# Patient Record
Sex: Male | Born: 1935 | Race: White | Hispanic: No | Smoking: Never smoker
Health system: Southern US, Community
[De-identification: ages and names within clinical notes are randomized; demographics above are authoritative.]

## PROBLEM LIST (undated history)

## (undated) DIAGNOSIS — I4891 Unspecified atrial fibrillation: Secondary | ICD-10-CM

---

## 2013-08-22 ENCOUNTER — Encounter (HOSPITAL_COMMUNITY): Payer: Self-pay | Admitting: Emergency Medicine

## 2013-08-22 ENCOUNTER — Emergency Department (HOSPITAL_COMMUNITY): Payer: Medicare PPO

## 2013-08-22 ENCOUNTER — Inpatient Hospital Stay (HOSPITAL_COMMUNITY)
Admission: EM | Admit: 2013-08-22 | Discharge: 2013-08-31 | DRG: 287 | Disposition: A | Discharge status: Home / Self Care | Payer: Medicare PPO | Attending: Internal Medicine | Admitting: Internal Medicine

## 2013-08-22 DIAGNOSIS — I2510 Atherosclerotic heart disease of native coronary artery without angina pectoris: Secondary | ICD-10-CM | POA: Diagnosis present

## 2013-08-22 DIAGNOSIS — M545 Low back pain, unspecified: Secondary | ICD-10-CM

## 2013-08-22 DIAGNOSIS — I25810 Atherosclerosis of coronary artery bypass graft(s) without angina pectoris: Secondary | ICD-10-CM

## 2013-08-22 DIAGNOSIS — R0989 Other specified symptoms and signs involving the circulatory and respiratory systems: Secondary | ICD-10-CM | POA: Diagnosis present

## 2013-08-22 DIAGNOSIS — R0609 Other forms of dyspnea: Secondary | ICD-10-CM | POA: Diagnosis present

## 2013-08-22 DIAGNOSIS — I10 Essential (primary) hypertension: Secondary | ICD-10-CM

## 2013-08-22 DIAGNOSIS — I4891 Unspecified atrial fibrillation: Principal | ICD-10-CM | POA: Diagnosis present

## 2013-08-22 DIAGNOSIS — E7800 Pure hypercholesterolemia, unspecified: Secondary | ICD-10-CM | POA: Diagnosis present

## 2013-08-22 DIAGNOSIS — M62838 Other muscle spasm: Secondary | ICD-10-CM | POA: Diagnosis present

## 2013-08-22 DIAGNOSIS — R55 Syncope and collapse: Secondary | ICD-10-CM | POA: Diagnosis present

## 2013-08-22 DIAGNOSIS — E876 Hypokalemia: Secondary | ICD-10-CM | POA: Diagnosis present

## 2013-08-22 HISTORY — DX: Unspecified atrial fibrillation: I48.91

## 2013-08-22 LAB — CBC WITH DIFFERENTIAL/PLATELET
BASOS PCT: 0 % (ref 0–1)
Basophils Absolute: 0 10*3/uL (ref 0.0–0.1)
Eosinophils Absolute: 0 10*3/uL (ref 0.0–0.7)
Eosinophils Relative: 0 % (ref 0–5)
HCT: 44.5 % (ref 39.0–52.0)
Hemoglobin: 15.3 g/dL (ref 13.0–17.0)
Lymphocytes Relative: 22 % (ref 12–46)
Lymphs Abs: 1.3 10*3/uL (ref 0.7–4.0)
MCH: 31.9 pg (ref 26.0–34.0)
MCHC: 34.4 g/dL (ref 30.0–36.0)
MCV: 92.7 fL (ref 78.0–100.0)
Monocytes Absolute: 0.8 10*3/uL (ref 0.1–1.0)
Monocytes Relative: 14 % — ABNORMAL HIGH (ref 3–12)
NEUTROS PCT: 64 % (ref 43–77)
Neutro Abs: 3.8 10*3/uL (ref 1.7–7.7)
PLATELETS: 213 10*3/uL (ref 150–400)
RBC: 4.8 MIL/uL (ref 4.22–5.81)
RDW: 14.4 % (ref 11.5–15.5)
WBC: 6 10*3/uL (ref 4.0–10.5)

## 2013-08-22 LAB — BASIC METABOLIC PANEL
BUN: 9 mg/dL (ref 6–23)
CALCIUM: 8.7 mg/dL (ref 8.4–10.5)
CHLORIDE: 96 meq/L (ref 96–112)
CO2: 28 mEq/L (ref 19–32)
Creatinine, Ser: 0.51 mg/dL (ref 0.50–1.35)
GFR calc Af Amer: >90 mL/min (ref >90)
GFR calc non Af Amer: >90 mL/min (ref >90)
Glucose, Bld: 102 mg/dL — ABNORMAL HIGH (ref 70–99)
POTASSIUM: 4.1 meq/L (ref 3.7–5.3)
SODIUM: 136 meq/L — AB (ref 137–147)

## 2013-08-22 LAB — I-STAT TROPONIN, ED: Troponin i, poc: 0 ng/mL (ref 0.00–0.08)

## 2013-08-22 MED ORDER — DILTIAZEM HCL 100 MG IV SOLR
5.0000 mg/h | INTRAVENOUS | Status: DC
  Administered 2013-08-22: 5 mg/h via INTRAVENOUS

## 2013-08-22 MED ORDER — DILTIAZEM HCL 25 MG/5ML IV SOLN: 10.0000 mg | Freq: Once | INTRAVENOUS | Status: DC

## 2013-08-22 MED ORDER — SODIUM CHLORIDE 0.9 % IV BOLUS (SEPSIS)
500.0000 mL | Freq: Once | INTRAVENOUS | Status: AC
  Administered 2013-08-22: 500 mL via INTRAVENOUS

## 2013-08-22 MED ORDER — DILTIAZEM LOAD VIA INFUSION
10.0000 mg | Freq: Once | INTRAVENOUS | Status: AC
Start: 2013-08-22 — End: 2013-08-22
  Administered 2013-08-22: 10 mg via INTRAVENOUS
  Filled 2013-08-22: qty 10

## 2013-08-22 MED ORDER — MORPHINE SULFATE 4 MG/ML IJ SOLN
4.0000 mg | Freq: Once | INTRAMUSCULAR | Status: AC
  Administered 2013-08-22: 4 mg via INTRAVENOUS
  Filled 2013-08-22: qty 1

## 2013-08-22 NOTE — ED Notes (Signed)
Patient is alert and oriented x3.  He is complaining of back pain that started this morning and progressively got  Worse after lunch.  He states that he has intermittently had this problem before.  Currently he rates his pain 8 to 10.

## 2013-08-22 NOTE — ED Notes (Addendum)
Pt BIB EMS, reports that pt has been "passing out and falling for the past few weeks" but has not passed out or fallen today. Pt states he is having increased lower back pain since awakening at 0700, shooting into his R leg and R leg feels weaker than his L. Pt drove 2 hours from [place] to [place] recently, also states that he is scheduled to see a cardiologist for follow-up of symptoms. Pt a&o x4, skin warm and dry, ambulatory to triage.

## 2013-08-22 NOTE — ED Provider Notes (Signed)
TIME SEEN: 10:30 PM   CHIEF COMPLAINT: Lower back pain  HPI: Patient is a 80 y.o. M with history of atrial fibrillation no longer on anticoagulation who presents the emergency department with complaints of lower back pain that started yesterday. He describes it as a tightening feeling is worse with walking. He denies to me that he has ever had back pain before. He denies any new numbness, weakness, bowel or bladder incontinence, fever. No history of injury. No history of increased activity.  During my evaluation when vitals were being taken, patient was found to be in atrial fibrillation with RVR.  He denies any chest pain or shortness of breath. He reports he has had multiple syncopal events in the past but none today. He states his last episode was last Saturday over a week ago. He states that his episodes will occur during activity and sometimes at rest. He feels that afterwards he has his "muscles lock up". He is scheduled to followup with his cardiologist as an outpatient for this. No palpitations. He is not on anticoagulation for his atrial fibrillation. He states he does not take any medications to control his heart rate.  This patient's primary care physician and cardiologist are in [place]. He is here visiting his daughter who is in a skilled nursing facility.  ROS: See HPI Constitutional: no fever  Eyes: no drainage  ENT: no runny nose   Cardiovascular:  no chest pain  Resp: no SOB  GI: no vomiting GU: no dysuria Integumentary: no rash  Allergy: no hives  Musculoskeletal: no leg swelling  Neurological: no slurred speech ROS otherwise negative  PAST MEDICAL HISTORY/PAST SURGICAL HISTORY:  History reviewed. No pertinent past medical history.  MEDICATIONS:  Prior to Admission medications   Medication Sig Start Date End Date Taking? Authorizing Provider  COD LIVER OIL PO Take by mouth.   Yes Historical Provider, MD  Glucosamine-Chondroit-MSM-C-Mn (FLEXI JOINT PO) Take  1 tablet by mouth daily.   Yes Historical Provider, MD  Multiple Vitamin (MULTIVITAMIN WITH MINERALS) TABS tablet Take 1 tablet by mouth daily.   Yes Historical Provider, MD  Saw Palmetto, Serenoa repens, (SAW PALMETTO PO) Take 1 tablet by mouth daily.   Yes Historical Provider, MD  VITAMIN E PO Take 1 tablet by mouth daily.   Yes Historical Provider, MD    ALLERGIES:  No Known Allergies  SOCIAL HISTORY:  History  Substance Use Topics  . Smoking status: Never Smoker   . Smokeless tobacco: Not on file  . Alcohol Use: Yes    FAMILY HISTORY: No family history on file.  EXAM: BP 128/109  Pulse 163  Temp(Src) 98.3 F (36.8 C) (Oral)  Resp 23  SpO2 98% CONSTITUTIONAL: Alert and oriented and responds appropriately to questions. Well-appearing; well-nourished HEAD: Normocephalic EYES: Conjunctivae clear, PERRL ENT: normal nose; no rhinorrhea; moist mucous membranes; pharynx without lesions noted; poor dentition NECK: Supple, no meningismus, no LAD  CARD: Irregularly irregular; S1 and S2 appreciated; no murmurs, no clicks, no rubs, no gallops RESP: Normal chest excursion without splinting or tachypnea; breath sounds clear and equal bilaterally; no wheezes, no rhonchi, no rales,  ABD/GI: Normal bowel sounds; non-distended; soft, non-tender, no rebound, no guarding BACK:  Diffusely tender to palpation across the lower lumbar region with no midline spinal tenderness or step-off or deformity, The back appears normal; there is no CVA tenderness; patient does have some muscle spasming and tightening of the paraspinal muscles of the lumbar spine EXT: Normal ROM in all  joints; non-tender to palpation; no edema; normal capillary refill; no cyanosis    SKIN: Normal color for age and race; warm NEURO: Moves all extremities equally; sensation to light touch intact diffusely, negative straight leg raise, 2+ deep tendon reflexes in bilateral upper and lower extremities, cranial nerves II through XII  intact PSYCH: The patient's mood and manner are appropriate. Grooming and personal hygiene are appropriate.  MEDICAL DECISION MAKING: Patient here with back pain and is likely related to muscle spasm. He is neurologically intact on exam and denies any red flag symptoms. We'll obtain x-ray of his lumbar spine given his age and give pain medication. Patient was also started in A. fib with RVR. He states he has had syncopal events in the past but no chest pain or shortness of breath today. No syncopal event today. We'll obtain basic labs, troponin, give IV fluids, diltiazem. Patient will likely need admission for syncopal workup and control of his heart rate.  ED PROGRESS: Patient's labs are unremarkable. Troponin negative.  UA pending.  Lumbar x-ray shows degenerative changes but no acute fracture. Official radiology read pending. Discussed with Dr. Julian Reil  hospitalist for admission to step down.     EKG Interpretation  Date/Time:  Tuesday Aug 22 2013 22:22:45 EDT Ventricular Rate:  157 PR Interval:  162 QRS Duration: 89 QT Interval:  299 QTC Calculation: 483 R Axis:   2 Text Interpretation:  Atrial fibrillation with RVR  Low voltage, extremity and precordial leads Anteroseptal infarct, old Minimal ST depression, inferior leads Borderline T abnormalities, diffuse leads Baseline wander in lead(s) V5 No old tracing to compare Confirmed by Auryn Paige,  DO, Gideon Burstein [id number]) on 08/22/2013 11:10:31 PM        Layla Maw Ashaki Frosch, DO 08/23/13 0006

## 2013-08-22 NOTE — ED Notes (Addendum)
Has had back pain that comes and goes. Hx syncopal episodes September 2014 and last Saturday. Says after fall in September 2014 his right leg is weaker than left. Reports "my muscles get so tense and sore as I pass out. I can't move them and I just hit the floor. I think these are minor strokes or an arthritis." Denies dizziness or light headedness. Denies loss of vision. Denies surgical hx or any precipitating factors. Does not take any medications besides vitamins at this time. Does report possible afib hx. Hooked up to EKG. MD Ward in to see patient at this time.

## 2013-08-23 ENCOUNTER — Encounter (HOSPITAL_COMMUNITY): Payer: Self-pay | Admitting: Internal Medicine

## 2013-08-23 DIAGNOSIS — M545 Low back pain, unspecified: Secondary | ICD-10-CM

## 2013-08-23 DIAGNOSIS — I059 Rheumatic mitral valve disease, unspecified: Secondary | ICD-10-CM

## 2013-08-23 DIAGNOSIS — I4891 Unspecified atrial fibrillation: Principal | ICD-10-CM

## 2013-08-23 DIAGNOSIS — R55 Syncope and collapse: Secondary | ICD-10-CM | POA: Diagnosis present

## 2013-08-23 LAB — URINALYSIS, ROUTINE W REFLEX MICROSCOPIC
Glucose, UA: NEGATIVE mg/dL
HGB URINE DIPSTICK: NEGATIVE
Ketones, ur: 15 mg/dL — AB
NITRITE: NEGATIVE
Protein, ur: 30 mg/dL — AB
SPECIFIC GRAVITY, URINE: 1.025 (ref 1.005–1.030)
Urobilinogen, UA: 4 mg/dL — ABNORMAL HIGH (ref 0.0–1.0)
pH: 7 (ref 5.0–8.0)

## 2013-08-23 LAB — URINE MICROSCOPIC-ADD ON

## 2013-08-23 LAB — MRSA PCR SCREENING: MRSA by PCR: NEGATIVE

## 2013-08-23 MED ORDER — CYCLOBENZAPRINE HCL 10 MG PO TABS
10.0000 mg | ORAL_TABLET | Freq: Three times a day (TID) | ORAL | Status: DC | PRN
  Administered 2013-08-23 – 2013-08-27 (×4): 10 mg via ORAL
  Filled 2013-08-23 (×4): qty 1

## 2013-08-23 MED ORDER — HEPARIN SODIUM (PORCINE) 5000 UNIT/ML IJ SOLN
5000.0000 [IU] | Freq: Three times a day (TID) | INTRAMUSCULAR | Status: DC
  Administered 2013-08-23 – 2013-08-26 (×10): 5000 [IU] via SUBCUTANEOUS
  Filled 2013-08-23 (×13): qty 1

## 2013-08-23 MED ORDER — SODIUM CHLORIDE 0.9 % IV SOLN
INTRAVENOUS | Status: DC
  Administered 2013-08-23 (×3): via INTRAVENOUS

## 2013-08-23 MED ORDER — DILTIAZEM HCL 30 MG PO TABS
30.0000 mg | ORAL_TABLET | Freq: Three times a day (TID) | ORAL | Status: DC
  Administered 2013-08-23 (×3): 30 mg via ORAL
  Filled 2013-08-23 (×7): qty 1

## 2013-08-23 MED ORDER — TRAMADOL HCL 50 MG PO TABS
50.0000 mg | ORAL_TABLET | Freq: Four times a day (QID) | ORAL | Status: DC | PRN
  Administered 2013-08-23 – 2013-08-25 (×4): 50 mg via ORAL
  Filled 2013-08-23 (×4): qty 1

## 2013-08-23 MED ORDER — DIAZEPAM 5 MG PO TABS
5.0000 mg | ORAL_TABLET | Freq: Four times a day (QID) | ORAL | Status: DC | PRN
  Administered 2013-08-23 (×2): 5 mg via ORAL
  Filled 2013-08-23 (×2): qty 1

## 2013-08-23 MED ORDER — ACETAMINOPHEN 325 MG PO TABS
650.0000 mg | ORAL_TABLET | Freq: Four times a day (QID) | ORAL | Status: DC | PRN
  Administered 2013-08-23 – 2013-08-24 (×2): 650 mg via ORAL
  Filled 2013-08-23 (×2): qty 2

## 2013-08-23 NOTE — Progress Notes (Signed)
CARE MANAGEMENT NOTE 08/23/2013  Patient:  Vincent Benjamin, Vincent Benjamin   Account Number:  [id number]  Date Initiated:  08/23/2013  Documentation initiated by:  DAVIS,RHONDA  Subjective/Objective Assessment:   atrial fib with rvr requiring iv Cardizem drip for control.     Action/Plan:   lives alone does have a family support system.   Anticipated DC Date:  08/26/2013   Anticipated DC Plan:  HOME/SELF CARE  In-house referral  NA      DC Planning Services  NA      [facility] Choice  NA   Choice offered to / List presented to:  NA   DME arranged  NA      DME agency  NA     HH arranged  NA      HH agency  NA   Status of service:  In process, will continue to follow Medicare Important Message given?  NA - LOS <3 / Initial given by admissions (If response is "NO", the following Medicare IM given date fields will be blank) Date Medicare IM given:   Date Additional Medicare IM given:    Discharge Disposition:    Per UR Regulation:  Reviewed for med. necessity/level of care/duration of stay  If discussed at Long Length of Stay Meetings, dates discussed:    Comments:  [id number]/Rhonda Stark Jock, BSN, [place] [phone] Chart Reviewed for discharge and hospital needs. Discharge needs at time of review: None present will follow for needs. Review of patient progress due on [id number].

## 2013-08-23 NOTE — Progress Notes (Signed)
Patient seen and evaluated earlier this am by my associate.  Will transition patient to oral cardizem and continue to monitor closely in step down unit.  Reassess next am.  Penny Pia

## 2013-08-23 NOTE — H&P (Addendum)
[facility] History and Physical  Vincent CraverRobert Stefanski ZOX:[id number] DOB: [birth year] DOA: 08/22/2013  Referring physician: EDP PCP: No primary provider on file.   Chief Complaint: Back pain   HPI: Vincent Benjamin is a 80 y.o. male from [place], in town visiting daughter who is in a SNF.  He presents to the ED with c/o back pain and muscle spasms.  Work up in the ED has revealed the back pain to most likely be musculoskeletal; however, during his work up his heart rate was noted to be in the 160s and he was found to be in A.Fib with RVR.  He states that he is asymptomatic from this, no CP no SOB, does occasionally have "passing-out" episodes which he describes as his "muscles lock up" and he is "unable to move due to the spasm", does not loose consciousness during these however he states.  He has a history of A.Fib, follows with a cardiologist in [place] but does not take any rate control meds and declines blood thinners (sounds more like a patient refusal than a medical reason not to use them).  In ED, rate controlled with cardizem gtt.  Review of Systems: Systems reviewed.  As above, otherwise negative  Past Medical History  Diagnosis Date  . Atrial fibrillation with RVR    History reviewed. No pertinent past surgical history. Social History:  reports that he has never smoked. He does not have any smokeless tobacco history on file. He reports that he drinks alcohol. His drug history is not on file.  No Known Allergies  No family history on file.   Prior to Admission medications   Medication Sig Start Date End Date Taking? Authorizing Provider  COD LIVER OIL PO Take by mouth.   Yes Historical Provider, MD  Glucosamine-Chondroit-MSM-C-Mn (FLEXI JOINT PO) Take 1 tablet by mouth daily.   Yes Historical Provider, MD  Multiple Vitamin (MULTIVITAMIN WITH MINERALS) TABS tablet Take 1 tablet by mouth daily.   Yes Historical Provider, MD  Saw Palmetto, Serenoa repens, (SAW PALMETTO PO) Take  1 tablet by mouth daily.   Yes Historical Provider, MD  VITAMIN E PO Take 1 tablet by mouth daily.   Yes Historical Provider, MD   Physical Exam: Filed Vitals:   08/23/13 0010  BP: 104/85  Pulse:   Temp:   Resp: 24    BP 104/85  Pulse 163  Temp(Src) 98.3 F (36.8 C) (Oral)  Resp 24  SpO2 98%  General Appearance:    Alert, oriented, no distress, appears stated age  Head:    Normocephalic, atraumatic  Eyes:    PERRL, EOMI, sclera non-icteric        Nose:   Nares without drainage or epistaxis. Mucosa, turbinates normal  Throat:   Moist mucous membranes. Oropharynx without erythema or exudate.  Neck:   Supple. No carotid bruits.  No thyromegaly.  No lymphadenopathy.   Back:     No CVA tenderness, no spinal tenderness  Lungs:     Clear to auscultation bilaterally, without wheezes, rhonchi or rales  Chest wall:    No tenderness to palpitation  Heart:    Irr, Irr, tachycardic  Abdomen:     Soft, non-tender, nondistended, normal bowel sounds, no organomegaly  Genitalia:    deferred  Rectal:    deferred  Extremities:   No clubbing, cyanosis or edema.  Pulses:   2+ and symmetric all extremities  Skin:   Skin color, texture, turgor normal, no rashes or lesions  Lymph nodes:  Cervical, supraclavicular, and axillary nodes normal  Neurologic:   CNII-XII intact. Normal strength, sensation and reflexes      throughout    Labs on Admission:  Basic Metabolic Panel:  Recent Labs Lab 08/22/13 2234  NA 136*  K 4.1  CL 96  CO2 28  GLUCOSE 102*  BUN 9  CREATININE 0.51  CALCIUM 8.7   Liver Function Tests: No results found for this basename: AST, ALT, ALKPHOS, BILITOT, PROT, ALBUMIN,  in the last 168 hours No results found for this basename: LIPASE, AMYLASE,  in the last 168 hours No results found for this basename: AMMONIA,  in the last 168 hours CBC:  Recent Labs Lab 08/22/13 2234  WBC 6.0  NEUTROABS 3.8  HGB 15.3  HCT 44.5  MCV 92.7  PLT 213   Cardiac Enzymes: No  results found for this basename: CKTOTAL, CKMB, CKMBINDEX, TROPONINI,  in the last 168 hours  BNP (last 3 results) No results found for this basename: PROBNP,  in the last 8760 hours CBG: No results found for this basename: GLUCAP,  in the last 168 hours  Radiological Exams on Admission: Dg Lumbar Spine Complete  08/23/2013   CLINICAL DATA:  Severe lower back pain, radiating down both legs.  EXAM: LUMBAR SPINE - COMPLETE 4+ VIEW  COMPARISON:  None.  FINDINGS: There is no evidence of fracture or subluxation. Vertebral bodies demonstrate normal height and alignment. Mild multilevel disc space narrowing is noted, with scattered anterior disc osteophyte complexes. There is mild right convex thoracolumbar scoliosis.  The visualized bowel gas pattern is unremarkable in appearance; air and stool are noted within the colon. The sacroiliac joints are within normal limits. Diffuse vascular calcifications are seen.  IMPRESSION: 1. No evidence of fracture or subluxation along the lumbar spine. 2. Mild right convex thoracolumbar scoliosis. 3. Diffuse vascular calcifications seen.   Electronically Signed   By: Roanna Raider M.D.   On: 08/23/2013 00:18    EKG: Independently reviewed.  Assessment/Plan Principal Problem:   Atrial fibrillation with RVR   1. A.Fib RVR - asymptomatic at this point, will rate control with cardizem gtt, patient declining anticoagulation at this point although I did offer, so will defer this to his cardiologist. 2. "Passing out" - upon further review with the patient, I think that these "passing out" episodes may simply be due to muscle spasm as opposed to true syncope as he does not loose consciousness during these episodes and is able to recall the entire event each time.  However, will perform syncope work up as I cannot exclude true syncope.  Will observe patient for any evidence of this here, tele monitor, and given the above A.Fib I certainly feel it is appropriate to get a 2d  Echo as he is at high risk for rate related heart changes. 3. Low back pain - will try some valium since this seems to be due to intermittent muscle spasm.    Code Status: Full Code  Family Communication: No family in room Disposition Plan: Admit to inpatient   Time spent: 23 min  Hillary Bow [facility] Pager [phone]  If 7AM-7PM, please contact the day team taking care of the patient [email or web address] Password [facility] 08/23/2013, 12:25 AM

## 2013-08-23 NOTE — Progress Notes (Signed)
*  PRELIMINARY RESULTS* Echocardiogram 2D Echocardiogram has been performed.  Katheren Puller 08/23/2013, 10:09 AM

## 2013-08-24 DIAGNOSIS — E876 Hypokalemia: Secondary | ICD-10-CM | POA: Diagnosis present

## 2013-08-24 LAB — BASIC METABOLIC PANEL
BUN: 8 mg/dL (ref 6–23)
CALCIUM: 7.7 mg/dL — AB (ref 8.4–10.5)
CO2: 25 meq/L (ref 19–32)
CREATININE: 0.53 mg/dL (ref 0.50–1.35)
Chloride: 100 mEq/L (ref 96–112)
GFR calc Af Amer: >90 mL/min (ref >90)
GFR calc non Af Amer: >90 mL/min (ref >90)
GLUCOSE: 86 mg/dL (ref 70–99)
Potassium: 3.3 mEq/L — ABNORMAL LOW (ref 3.7–5.3)
Sodium: 137 mEq/L (ref 137–147)

## 2013-08-24 LAB — MAGNESIUM: Magnesium: 1.4 mg/dL — ABNORMAL LOW (ref 1.5–2.5)

## 2013-08-24 LAB — PHOSPHORUS: PHOSPHORUS: 2.4 mg/dL (ref 2.3–4.6)

## 2013-08-24 MED ORDER — POTASSIUM CHLORIDE CRYS ER 20 MEQ PO TBCR
40.0000 meq | EXTENDED_RELEASE_TABLET | Freq: Once | ORAL | Status: AC
  Administered 2013-08-24: 40 meq via ORAL
  Filled 2013-08-24: qty 2

## 2013-08-24 MED ORDER — DILTIAZEM HCL ER COATED BEADS 120 MG PO CP24
120.0000 mg | ORAL_CAPSULE | Freq: Every day | ORAL | Status: DC
  Administered 2013-08-24 – 2013-08-25 (×2): 120 mg via ORAL
  Filled 2013-08-24 (×2): qty 1

## 2013-08-24 MED ORDER — MAGNESIUM SULFATE 40 MG/ML IJ SOLN
2.0000 g | Freq: Once | INTRAMUSCULAR | Status: AC
  Administered 2013-08-24: 2 g via INTRAVENOUS
  Filled 2013-08-24: qty 50

## 2013-08-24 MED ORDER — SODIUM CHLORIDE 0.9 % IV BOLUS (SEPSIS)
250.0000 mL | Freq: Once | INTRAVENOUS | Status: AC
  Administered 2013-08-24: 250 mL via INTRAVENOUS

## 2013-08-24 NOTE — Progress Notes (Addendum)
[facility] PROGRESS NOTE  Vincent CraverRobert Benjamin NGE:[id number] DOB: 28-Dec-1927 DOA: 08/22/2013 PCP: No primary provider on file.  Assessment/Plan: Principal Problem:   Atrial fibrillation with RVR - Plan will be to transfer to telemetry floor. - Will continue cardizem long acting oral regimen - recorded HR of 40 or 46 error per my discussion with nursing. - If HR well controlled within the next 24 hours will plan on discharge planning  Addendum: Hypomagnasemia - replace IV and reassess next am  Hypokalemia - replace orally and reassess next am.  Code Status: full Family Communication: No family at bedside.  Consultants:  None  Procedures:  None  Antibiotics:  None  HPI/Subjective: No new complaints. Pt feels better today. Still having cramps  Objective: Filed Vitals:   08/24/13 1115  BP: 97/67  Pulse: 106  Temp: 97.7 F (36.5 C)  Resp: 19    Intake/Output Summary (Last 24 hours) at 08/24/13 1417 Last data filed at 08/24/13 1015  Gross per 24 hour  Intake   3330 ml  Output    250 ml  Net   3080 ml   Filed Weights   08/23/13 0200  Weight: 66 kg (145 lb 8.1 oz)    Exam:   General:  Pt in NAD, alert and awake  Cardiovascular: irregularly irregular, no murmurs  Respiratory: CTA BL, no wheezes  Abdomen: soft, NT, ND  Musculoskeletal: no cyanosis or clubbing   Data Reviewed: Basic Metabolic Panel:  Recent Labs Lab 08/22/13 2234 08/24/13 1215  NA 136* 137  K 4.1 3.3*  CL 96 100  CO2 28 25  GLUCOSE 102* 86  BUN 9 8  CREATININE 0.51 0.53  CALCIUM 8.7 7.7*  MG  --  1.4*  PHOS  --  2.4   Liver Function Tests: No results found for this basename: AST, ALT, ALKPHOS, BILITOT, PROT, ALBUMIN,  in the last 168 hours No results found for this basename: LIPASE, AMYLASE,  in the last 168 hours No results found for this basename: AMMONIA,  in the last 168 hours CBC:  Recent Labs Lab 08/22/13 2234  WBC 6.0  NEUTROABS 3.8  HGB 15.3  HCT 44.5   MCV 92.7  PLT 213   Cardiac Enzymes: No results found for this basename: CKTOTAL, CKMB, CKMBINDEX, TROPONINI,  in the last 168 hours BNP (last 3 results) No results found for this basename: PROBNP,  in the last 8760 hours CBG: No results found for this basename: GLUCAP,  in the last 168 hours  Recent Results (from the past 240 hour(s))  MRSA PCR SCREENING     Status: None   Collection Time    08/23/13  1:19 AM      Result Value Ref Range Status   MRSA by PCR NEGATIVE  NEGATIVE Final   Comment:            The GeneXpert MRSA Assay (FDA     approved for NASAL specimens     only), is one component of a     comprehensive MRSA colonization     surveillance program. It is not     intended to diagnose MRSA     infection nor to guide or     monitor treatment for     MRSA infections.     Studies: Dg Lumbar Spine Complete  08/23/2013   CLINICAL DATA:  Severe lower back pain, radiating down both legs.  EXAM: LUMBAR SPINE - COMPLETE 4+ VIEW  COMPARISON:  None.  FINDINGS: There is  no evidence of fracture or subluxation. Vertebral bodies demonstrate normal height and alignment. Mild multilevel disc space narrowing is noted, with scattered anterior disc osteophyte complexes. There is mild right convex thoracolumbar scoliosis.  The visualized bowel gas pattern is unremarkable in appearance; air and stool are noted within the colon. The sacroiliac joints are within normal limits. Diffuse vascular calcifications are seen.  IMPRESSION: 1. No evidence of fracture or subluxation along the lumbar spine. 2. Mild right convex thoracolumbar scoliosis. 3. Diffuse vascular calcifications seen.   Electronically Signed   By: Roanna Raider M.D.   On: 08/23/2013 00:18    Scheduled Meds: . diltiazem  120 mg Oral Daily  . heparin  5,000 Units Subcutaneous 3 times per day   Continuous Infusions:    Time spent: > 35 minutes    Penny Pia  [facility] Pager [phone]. If 7PM-7AM, please contact  night-coverage at [email or web address], password [facility] 08/24/2013, 2:17 PM  LOS: 2 days

## 2013-08-25 LAB — MAGNESIUM: Magnesium: 1.8 mg/dL (ref 1.5–2.5)

## 2013-08-25 MED ORDER — HYDROCODONE-ACETAMINOPHEN 5-325 MG PO TABS
1.0000 | ORAL_TABLET | Freq: Four times a day (QID) | ORAL | Status: DC | PRN
  Administered 2013-08-25 – 2013-08-27 (×3): 1 via ORAL
  Filled 2013-08-25 (×3): qty 1

## 2013-08-25 MED ORDER — POTASSIUM CHLORIDE CRYS ER 20 MEQ PO TBCR
40.0000 meq | EXTENDED_RELEASE_TABLET | Freq: Once | ORAL | Status: AC
  Administered 2013-08-25: 40 meq via ORAL
  Filled 2013-08-25 (×2): qty 2

## 2013-08-25 MED ORDER — METOPROLOL TARTRATE 1 MG/ML IV SOLN: 5.0000 mg | Freq: Once | INTRAVENOUS | Status: DC

## 2013-08-25 MED ORDER — DILTIAZEM HCL ER COATED BEADS 180 MG PO CP24
180.0000 mg | ORAL_CAPSULE | Freq: Every day | ORAL | Status: DC
  Administered 2013-08-26: 180 mg via ORAL
  Filled 2013-08-25 (×2): qty 1

## 2013-08-25 NOTE — Care Management Note (Addendum)
Page 1 of 2   08/31/2013     3:08:13 PM CARE MANAGEMENT NOTE 08/31/2013  Patient:  Ramond CraverELSON,Aedyn   Account Number:  [id number]  Date Initiated:  08/23/2013  Documentation initiated by:  DAVIS,RHONDA  Subjective/Objective Assessment:   atrial fib with rvr requiring iv Cardizem drip for control.     Action/Plan:   lives alone does have a family support system.   Anticipated DC Date:  08/26/2013   Anticipated DC Plan:  HOME W HOME HEALTH SERVICES  In-house referral  NA      DC Planning Services  CM consult      [facility]  HOME HEALTH  HOME HEALTH   Choice offered to / List presented to:  C-1 Patient   DME arranged  [facility]      DME agency  OTHER - SEE NOTE     HH arranged  HH-1 RN  HH-10 DISEASE MANAGEMENT  HH-2 PT  HH-3 OT  HH-4 NURSE'S AIDE      HH agency  OTHER - SEE NOTE   Status of service:  Completed, signed off Medicare Important Message given?  YES (If response is "NO", the following Medicare IM given date fields will be blank) Date Medicare IM given:  08/25/2013 Date Additional Medicare IM given:  08/30/2013  Discharge Disposition:  HOME W HOME HEALTH SERVICES  Per UR Regulation:  Reviewed for med. necessity/level of care/duration of stay  If discussed at Long Length of Stay Meetings, dates discussed:   08/31/2013    Comments:  08-31-13 1458 Tomi BambergerBrenda Graves-Bigelow, RN,BSN [phone] CM did [facility] information [facility] in [place] and they will [facility]. CM will provide pt with 30 day free card. No further needs from CM at this time. [facility] [place] Address: [address], [place], [place] [address] [phone]- Please send Rx to this phamracy. CM did call for eliquis and medication is available.   08-31-13 [address] Krishika Bugge GravesMitzie Na- Bigelow, [place],BSN [phone] CM faxed HH orders ot [facility] [place] to see if they can provide services. DME in room.   08-30-13 [address]  [place] Boeding Graves-Bigelow, RN,BSN [phone] CM  received [facility] back [facility]. Agency will not be able to service pt  at this time. CM did call [facility] to see if [facility] [place] [facility] will be able to service. CM will f/u in am.  08-30-13 1440 Tomi BambergerBrenda Graves-Bigelow, RN,BSN [phone] CM did speak to pt in [facility]. Pt is refusing SNF and [facility]. [place] in [place]. CM did call [facility] in [place] and they can service zip code. CM spoke to [facility] CM will need [facility], PT OT and Aide. CM did [facility]. RW to be delivered to room before d/c. No further needs from CM at this time.    08/28/2013- Talked to patient about physical therapy recommendation for home health PT; patient is not sure at this time if he wants it and wants to know the copay; benefit check in progress; CM to follow up - patient lives in [place] and is her visiting his daughter that resides in a nursing facility; patient plans to return home at discharge; Alexis GoodellB Chandler RN,BSN,MHA [phone] Talked to patient again about possible HHPT at discharge; pt stated" I don't think that I need that". Patient plans to go back to  [place] the day of discharge; CM informed patient that if he changed his mind his PCP can make arrangements for home therapy after dischargeAlexis Goodell [phone]  08/25/13 KATHY MAHABIR RN,BSN NCM [phone] TRANSFER FROM [email or web address].PATIENT NOT SURE IF HE WILL STAY IN [place] WITH FAMILY OR GO BACK TO [place]I PROVIDED [facility] [place]IST FOR [place] [address](COPY IN [facility] AGENCY FOR [place].  [id number] Earlene Plater, RN, BSN, [place] [phone] Chart Reviewed for discharge and hospital needs. Discharge needs at time of review: None present will follow for needs. Review of patient progress due on [id number].

## 2013-08-25 NOTE — Evaluation (Signed)
Physical Therapy Evaluation Patient Details Name: Vincent Benjamin MRN: [id number] DOB: 10-Sep-1927 Today's Date: 08/25/2013   History of Present Illness  Pt is an 80 year old male visiting his daughter (in SNF) from [place] and presented to ED with back pain felt to most likely be musculoskeletal; however, during his work up his heart rate was noted to be in the 160s and found to be in A.Fib with RVR.    Clinical Impression   Pt currently with functional limitations due to the deficits listed below (see PT Problem List).  Pt will benefit from skilled PT to increase their independence and safety with mobility to allow discharge to the venue listed below.  Pt reporting back pain and R knee pain/popping/buckling.  Pt states hx of falls due to R LE pain/giving way and also reports stiffness, muscle spasms causing him to be unable to move in the mornings.  Increased time spent on safe mobility at his home vs his daughter's home, follow up physical therapy, and recommendations to see PCP or orthopaedic MD for f/u on back, knee, shoulder pain as pt very concerned with reasoning behind all his pain issues however denies discussing this with an MD previous to admission.     Follow Up Recommendations Home health PT    Equipment Recommendations  Rolling walker with 5" wheels    Recommendations for Other Services       Precautions / Restrictions Precautions Precautions: Fall Restrictions Weight Bearing Restrictions: No      Mobility  Bed Mobility Overal bed mobility: Needs Assistance Bed Mobility: Supine to Sit     Supine to sit: Min assist     General bed mobility comments: requested assist for trunk upright due to back pain  Transfers Overall transfer level: Needs assistance Equipment used: Rolling walker (2 wheeled) Transfers: Sit to/from [employer] CorporationStand;Stand Pivot Transfers Sit to Stand: Min guard Stand pivot transfers: Min guard       General transfer comment: verbal cues for safe  technique with RW, pt reports R knee causes pain/buckling during gait which he believes causes his falls at home and agreeable to use RW  Ambulation/Gait Ambulation/Gait assistance: Min guard Ambulation Distance (Feet): 320 Feet Assistive device: Rolling walker (2 wheeled) Gait Pattern/deviations: Step-through pattern;Decreased stride length     General Gait Details: antalgic gait towards end of ambulation due to increased R knee pain, steadiness improved with use of RW  Stairs            Wheelchair Mobility    Modified Rankin (Stroke Patients Only)       Balance                                             Pertinent Vitals/Pain L shoulder, R knee, and low back pain all reported however pt agreeable to activity as tolerated, RN aware HR around 135 bpm during gait, RN aware    Home Living Family/patient expects to be discharged to:: Private residence Living Arrangements: Alone   Type of Home: House Home Access: Ramped entrance     Home Layout: One level Home Equipment: Environmental consultantWalker - 2 wheels Additional Comments: daughter's house where is currently staying has RW and ramp, his home has no DME    Prior Function Level of Independence: Independent               Hand Dominance  Extremity/Trunk Assessment   Upper Extremity Assessment: LUE deficits/detail       LUE Deficits / Details: unable to perform active shoulder ROM above 80*   Lower Extremity Assessment: RLE deficits/detail RLE Deficits / Details: reports hip pain with active hip/knee flexion, unable to achieve full range due to pain in hip, ankle WNL, denies numbness/tinging       Communication   Communication: No difficulties  Cognition Arousal/Alertness: Awake/alert Behavior During Therapy: WFL for tasks assessed/performed Overall Cognitive Status: Within Functional Limits for tasks assessed                      General Comments      Exercises         Assessment/Plan    PT Assessment Patient needs continued PT services  PT Diagnosis Difficulty walking   PT Problem List Decreased strength;Decreased range of motion;Decreased mobility;Pain;Decreased knowledge of use of DME  PT Treatment Interventions DME instruction;Gait training;Functional mobility training;Therapeutic activities;Therapeutic exercise;Patient/family education;Balance training   PT Goals (Current goals can be found in the Care Plan section) Acute Rehab PT Goals PT Goal Formulation: With patient Time For Goal Achievement: 09/01/13 Potential to Achieve Goals: Good    Frequency Min 3X/week   Barriers to discharge        Co-evaluation               End of Session Equipment Utilized During Treatment: Gait belt Activity Tolerance: Patient limited by fatigue;Patient limited by pain Patient left: in chair;with call bell/phone within reach Nurse Communication: Mobility status         Time: 5852-7782 PT Time Calculation (min): 29 min   Charges:   PT Evaluation $Initial PT Evaluation Tier I: 1 Procedure PT Treatments $Gait Training: 8-22 mins $Self Care/Home Management: 8-22   PT G Codes:          Lynnell Catalan 08/25/2013, 10:53 AM Zenovia Jarred, PT, DPT 08/25/2013 Pager: [phone]

## 2013-08-25 NOTE — Progress Notes (Addendum)
[facility] PROGRESS NOTE  Vincent Benjamin DQQ:[id number] DOB: 12/21/1927 DOA: 08/22/2013 PCP: No primary provider on file.  Assessment/Plan: Principal Problem:   Atrial fibrillation with RVR - Plan will be to transfer to telemetry floor. - Will continue cardizem long acting oral regimen but will increase dose given recent episode of afib with rvr with activity - recorded HR of 40 or 46 error per my discussion with nursing. - If HR well controlled within the next 24 hours will plan on discharge planning Addendum: Pt has h/o declining blood thinners.  Hypomagnasemia - improved after IV replacement and within normal limits.  Hypokalemia - replaced orally. - recheck next am 08/26/13  Code Status: full Family Communication: No family at bedside.  Consultants:  None  Procedures:  None  Antibiotics:  None  HPI/Subjective: No new complaints. Pt feels better today. Still having cramps  Objective: Filed Vitals:   08/25/13 1359  BP: 112/61  Pulse: 72  Temp: 97.9 F (36.6 C)  Resp: 18    Intake/Output Summary (Last 24 hours) at 08/25/13 1701 Last data filed at 08/25/13 1024  Gross per 24 hour  Intake    480 ml  Output      0 ml  Net    480 ml   Filed Weights   08/23/13 0200 08/24/13 1558  Weight: 66 kg (145 lb 8.1 oz) 70.035 kg (154 lb 6.4 oz)    Exam:   General:  Pt in NAD, alert and awake  Cardiovascular: irregularly irregular, no murmurs  Respiratory: CTA BL, no wheezes  Abdomen: soft, NT, ND  Musculoskeletal: no cyanosis or clubbing   Data Reviewed: Basic Metabolic Panel:  Recent Labs Lab 08/22/13 2234 08/24/13 1215 08/25/13 0409  NA 136* 137  --   K 4.1 3.3*  --   CL 96 100  --   CO2 28 25  --   GLUCOSE 102* 86  --   BUN 9 8  --   CREATININE 0.51 0.53  --   CALCIUM 8.7 7.7*  --   MG  --  1.4* 1.8  PHOS  --  2.4  --    Liver Function Tests: No results found for this basename: AST, ALT, ALKPHOS, BILITOT, PROT, ALBUMIN,  in the  last 168 hours No results found for this basename: LIPASE, AMYLASE,  in the last 168 hours No results found for this basename: AMMONIA,  in the last 168 hours CBC:  Recent Labs Lab 08/22/13 2234  WBC 6.0  NEUTROABS 3.8  HGB 15.3  HCT 44.5  MCV 92.7  PLT 213   Cardiac Enzymes: No results found for this basename: CKTOTAL, CKMB, CKMBINDEX, TROPONINI,  in the last 168 hours BNP (last 3 results) No results found for this basename: PROBNP,  in the last 8760 hours CBG: No results found for this basename: GLUCAP,  in the last 168 hours  Recent Results (from the past 240 hour(s))  MRSA PCR SCREENING     Status: None   Collection Time    08/23/13  1:19 AM      Result Value Ref Range Status   MRSA by PCR NEGATIVE  NEGATIVE Final   Comment:            The GeneXpert MRSA Assay (FDA     approved for NASAL specimens     only), is one component of a     comprehensive MRSA colonization     surveillance program. It is not     intended to  diagnose MRSA     infection nor to guide or     monitor treatment for     MRSA infections.     Studies: No results found.  Scheduled Meds: . [START ON 08/26/2013] diltiazem  180 mg Oral Daily  . heparin  5,000 Units Subcutaneous 3 times per day   Continuous Infusions:    Time spent: > 35 minutes    Penny Piarlando Tae Vonada  [facility] Pager [phone]. If 7PM-7AM, please contact night-coverage at [email or web address], password [email or web address] 08/25/2013, 5:01 PM  LOS: 3 days

## 2013-08-25 NOTE — Progress Notes (Signed)
Patients heart rhythm/rate remains in a fib. Rate fluctuates bet. 120's - 150's at rest. MD notified. Patient asymptomatic, no CP or SOB. BP 101/77. Will continue to monitor. J.Launi Asencio, RN

## 2013-08-26 LAB — CBC
HCT: 40.4 % (ref 39.0–52.0)
Hemoglobin: 13.6 g/dL (ref 13.0–17.0)
MCH: 31.5 pg (ref 26.0–34.0)
MCHC: 33.7 g/dL (ref 30.0–36.0)
MCV: 93.5 fL (ref 78.0–100.0)
PLATELETS: 281 10*3/uL (ref 150–400)
RBC: 4.32 MIL/uL (ref 4.22–5.81)
RDW: 14.7 % (ref 11.5–15.5)
WBC: 7 10*3/uL (ref 4.0–10.5)

## 2013-08-26 LAB — BASIC METABOLIC PANEL
BUN: 9 mg/dL (ref 6–23)
CHLORIDE: 101 meq/L (ref 96–112)
CO2: 25 meq/L (ref 19–32)
CREATININE: 0.52 mg/dL (ref 0.50–1.35)
Calcium: 7.9 mg/dL — ABNORMAL LOW (ref 8.4–10.5)
GFR calc non Af Amer: >90 mL/min (ref >90)
GLUCOSE: 90 mg/dL (ref 70–99)
Potassium: 4.5 mEq/L (ref 3.7–5.3)
Sodium: 133 mEq/L — ABNORMAL LOW (ref 137–147)

## 2013-08-26 LAB — APTT: aPTT: 36 seconds (ref 24–37)

## 2013-08-26 LAB — MAGNESIUM: Magnesium: 1.8 mg/dL (ref 1.5–2.5)

## 2013-08-26 LAB — HEPARIN LEVEL (UNFRACTIONATED): Heparin Unfractionated: 0.31 IU/mL (ref 0.30–0.70)

## 2013-08-26 MED ORDER — HEPARIN (PORCINE) IN NACL 100-0.45 UNIT/ML-% IJ SOLN
1000.0000 [IU]/h | INTRAMUSCULAR | Status: DC
  Administered 2013-08-26: 1000 [IU]/h via INTRAVENOUS
  Filled 2013-08-26: qty 250

## 2013-08-26 MED ORDER — LISINOPRIL 2.5 MG PO TABS
2.5000 mg | ORAL_TABLET | Freq: Every day | ORAL | Status: DC
  Administered 2013-08-26 – 2013-08-31 (×6): 2.5 mg via ORAL
  Filled 2013-08-26 (×6): qty 1

## 2013-08-26 MED ORDER — HEPARIN BOLUS VIA INFUSION
3000.0000 [IU] | Freq: Once | INTRAVENOUS | Status: AC
  Administered 2013-08-26: 3000 [IU] via INTRAVENOUS
  Filled 2013-08-26: qty 3000

## 2013-08-26 MED ORDER — CARVEDILOL 3.125 MG PO TABS
3.1250 mg | ORAL_TABLET | Freq: Two times a day (BID) | ORAL | Status: DC
  Administered 2013-08-26 – 2013-08-30 (×7): 3.125 mg via ORAL
  Filled 2013-08-26 (×10): qty 1

## 2013-08-26 NOTE — Progress Notes (Signed)
[facility] PROGRESS NOTE  Vincent CraverRobert Benjamin ZOX:[id number] DOB: 02/20/1928 DOA: 08/22/2013 PCP: No primary provider on file.  Assessment/Plan: Principal Problem:   Atrial fibrillation with RVR - Plan will be to transfer to telemetry floor. - Will continue cardizem long acting oral regimen but will increase dose given recent episode of afib with rvr with activity - recorded HR of 40 or 46 error per my discussion with nursing. - Pt still having elevations in HR despite up titration of his Cardizem.  As a result I will contact Cardiology for further evaluation and recommendations. - Pt has h/o declining blood thinners.  Hypomagnasemia - improved after IV replacement and within normal limits.  Hypokalemia - replaced orally and within normal limits currently with last value at 4.5 - recheck next am 08/26/13  Code Status: full Family Communication: No family at bedside.  Consultants:  None  Procedures:  None  Antibiotics:  None  HPI/Subjective: No new complaints. Pt feels better today. States that cramps have resolved.  Objective: Filed Vitals:   08/26/13 0525  BP: 114/80  Pulse: 93  Temp: 98.2 F (36.8 C)  Resp:     Intake/Output Summary (Last 24 hours) at 08/26/13 1220 Last data filed at 08/25/13 2300  Gross per 24 hour  Intake    360 ml  Output      0 ml  Net    360 ml   Filed Weights   08/23/13 0200 08/24/13 1558  Weight: 66 kg (145 lb 8.1 oz) 70.035 kg (154 lb 6.4 oz)    Exam:   General:  Pt in NAD, alert and awake  Cardiovascular: irregularly irregular, no murmurs  Respiratory: CTA BL, no wheezes  Abdomen: soft, NT, ND  Musculoskeletal: no cyanosis or clubbing   Data Reviewed: Basic Metabolic Panel:  Recent Labs Lab 08/22/13 2234 08/24/13 1215 08/25/13 0409 08/26/13 0408  NA 136* 137  --  133*  K 4.1 3.3*  --  4.5  CL 96 100  --  101  CO2 28 25  --  25  GLUCOSE 102* 86  --  90  BUN 9 8  --  9  CREATININE 0.51 0.53  --  0.52   CALCIUM 8.7 7.7*  --  7.9*  MG  --  1.4* 1.8 1.8  PHOS  --  2.4  --   --    Liver Function Tests: No results found for this basename: AST, ALT, ALKPHOS, BILITOT, PROT, ALBUMIN,  in the last 168 hours No results found for this basename: LIPASE, AMYLASE,  in the last 168 hours No results found for this basename: AMMONIA,  in the last 168 hours CBC:  Recent Labs Lab 08/22/13 2234  WBC 6.0  NEUTROABS 3.8  HGB 15.3  HCT 44.5  MCV 92.7  PLT 213   Cardiac Enzymes: No results found for this basename: CKTOTAL, CKMB, CKMBINDEX, TROPONINI,  in the last 168 hours BNP (last 3 results) No results found for this basename: PROBNP,  in the last 8760 hours CBG: No results found for this basename: GLUCAP,  in the last 168 hours  Recent Results (from the past 240 hour(s))  MRSA PCR SCREENING     Status: None   Collection Time    08/23/13  1:19 AM      Result Value Ref Range Status   MRSA by PCR NEGATIVE  NEGATIVE Final   Comment:            The GeneXpert MRSA Assay (FDA  approved for NASAL specimens     only), is one component of a     comprehensive MRSA colonization     surveillance program. It is not     intended to diagnose MRSA     infection nor to guide or     monitor treatment for     MRSA infections.     Studies: No results found.  Scheduled Meds: . diltiazem  180 mg Oral Daily  . heparin  5,000 Units Subcutaneous 3 times per day   Continuous Infusions:    Time spent: > 35 minutes    Penny Pia  [facility] Pager [phone]. If 7PM-7AM, please contact night-coverage at [email or web address], password [facility] 08/26/2013, 12:20 PM  LOS: 4 days

## 2013-08-26 NOTE — Progress Notes (Signed)
MD made aware of  pts HR on 140's-150's , still afib.

## 2013-08-26 NOTE — Consult Note (Signed)
Reason for Consult: Atrial fibrillation with rapid ventricular response Referring Physician: Dr. Bernell List Benjamin is an 80 y.o. male.  HPI: Patient is 80 year old male with past medical history significant for hypertension, chronic atrial fibrillation, resident of [place] was admitted because of recurrent falls and muscle spasm in the legs and was noted to be in A. fib with RVR. Patient denies any chest pains but complains of feeling tired short of breath weak with minimal exertion. Denies any palpitation lightheadedness or syncope. States was on warfarin in the past for atrial fibrillation but he stopped when do not want to take any anticoagulants. Patient also states he was on Pradaxa for some time but stopped it due to 2 kidney problems. Patient denies any history of bleeding in the past. Denies any cardiac workup i.e. stress test or cardiac catheterization in the past.  Past Medical History  Diagnosis Date  . Atrial fibrillation with RVR     History reviewed. No pertinent past surgical history.  History reviewed. No pertinent family history.  Social History:  reports that he has never smoked. He has never used smokeless tobacco. He reports that he drinks alcohol. His drug history is not on file.  Allergies: No Known Allergies  Medications: I have reviewed the patient's current medications.  Results for orders placed during the hospital encounter of 08/22/13 (from the past 48 hour(s))  MAGNESIUM     Status: None   Collection Time    08/25/13  4:09 AM      Result Value Ref Range   Magnesium 1.8  1.5 - 2.5 mg/dL  BASIC METABOLIC PANEL     Status: Abnormal   Collection Time    08/26/13  4:08 AM      Result Value Ref Range   Sodium 133 (*) 137 - 147 mEq/L   Potassium 4.5  3.7 - 5.3 mEq/L   Comment: DELTA CHECK NOTED     REPEATED TO VERIFY   Chloride 101  96 - 112 mEq/L   CO2 25  19 - 32 mEq/L   Glucose, Bld 90  70 - 99 mg/dL   BUN 9  6 - 23 mg/dL   Creatinine, Ser 0.52  0.50  - 1.35 mg/dL   Calcium 7.9 (*) 8.4 - 10.5 mg/dL   GFR calc non Af Amer >90  >90 mL/min   GFR calc Af Amer >90  >90 mL/min   Comment: (NOTE)     The eGFR has been calculated using the CKD EPI equation.     This calculation has not been validated in all clinical situations.     eGFR's persistently <90 mL/min signify possible Chronic Kidney     Disease.  MAGNESIUM     Status: None   Collection Time    08/26/13  4:08 AM      Result Value Ref Range   Magnesium 1.8  1.5 - 2.5 mg/dL    No results found.  Review of Systems  Constitutional: Negative for fever, chills and weight loss.  HENT: Negative for hearing loss and tinnitus.   Eyes: Negative for blurred vision, double vision and photophobia.  Respiratory: Positive for shortness of breath. Negative for cough, hemoptysis and sputum production.   Cardiovascular: Positive for palpitations. Negative for chest pain, orthopnea, claudication and leg swelling.  Gastrointestinal: Negative for nausea, vomiting, abdominal pain and diarrhea.  Genitourinary: Negative for dysuria.  Musculoskeletal: Positive for falls and myalgias.  Skin: Negative for rash.  Neurological: Positive for dizziness. Negative for headaches.  Blood pressure 114/80, pulse 93, temperature 98.2 F (36.8 C), temperature source Oral, resp. rate 19, height $RemoveBe'5\' 5"'[id number]$  (1.651 m), weight 70.035 kg (154 lb 6.4 oz), SpO2 98.00%. Physical Exam  Constitutional: He is oriented to person, place, and time.  HENT:  Head: Normocephalic and atraumatic.  Eyes: Conjunctivae are normal. Left eye exhibits no discharge.  Neck: Normal range of motion. Neck supple. No JVD present. No tracheal deviation present. No thyromegaly present.  Cardiovascular:  Irregular irregular S1 and S2 soft there is soft systolic murmur noted  Respiratory:  Decreased breath sound at bases  GI: Soft. Bowel sounds are normal. He exhibits no distension. There is no tenderness. There is no rebound.  Musculoskeletal: He  exhibits no edema and no tenderness.  Neurological: He is alert and oriented to person, place, and time.    Assessment/Plan: Atrial fibrillation with moderate ventricular response Exertional dyspnea/generalized weakness tiredness probably secondary to above rule out coronary insufficiency Hypertension Hypercholesteremia Plan We'll add low-dose carvedilol and ACE inhibitors in view of depressed LV systolic function. Head statins as per ordered Start IV heparin for now patient agrees for short-term anticoagulation for now We'll schedule for [facility] in a.m. Vincent Benjamin 08/26/2013, 1:05 PM

## 2013-08-26 NOTE — Progress Notes (Signed)
ANTICOAGULATION CONSULT NOTE - Initial Consult  Pharmacy Consult for IV heparin Indication: atrial fibrillation  No Known Allergies  Patient Measurements: Height: 5\' 5"  (165.1 cm) Weight: 154 lb 6.4 oz (70.035 kg) IBW/kg (Calculated) : 61.5  Vital Signs: Temp: 98.2 F (36.8 C) (05/30 0525) Temp src: Oral (05/30 0525) BP: 114/80 mmHg (05/30 0525) Pulse Rate: 93 (05/30 0525)  Labs:  Recent Labs  08/24/13 1215 08/26/13 0408  CREATININE 0.53 0.52    Estimated Creatinine Clearance: 60.9 ml/min (by C-G formula based on Cr of 0.52).   Medical History: Past Medical History  Diagnosis Date  . Atrial fibrillation with RVR     Medications:  Scheduled:  . carvedilol  3.125 mg Oral BID WC  . diltiazem  180 mg Oral Daily  . lisinopril  2.5 mg Oral Daily   Infusions:    Assessment: 80 yo male currently in Afib to start IV heparin per pharmacy per cards order. PMH includes HTN, chronic afib - states was on Afib in past and Pradaxa but that was stopped d/t kidney problems. Baseline CBC good on 5/26 but will draw another baseline prior to starting IV heparin this PM  Goal of Therapy:  Heparin level 0.3-0.7 units/ml Monitor platelets by anticoagulation protocol: Yes   Plan: After STAT CBC and aPTT drawn: 1) start 3000 unit IV heparin bolus then 2) IV heparin 1000 units/hr 3) 8 hr heparin level 4) Daily CBC and heparin level while on IV heparin   Hessie Knows, PharmD, BCPS Pager [phone] 08/26/2013 1:26 PM

## 2013-08-26 NOTE — Progress Notes (Signed)
Seen by Dr. Sharyn Lull with orders.

## 2013-08-27 ENCOUNTER — Ambulatory Visit (HOSPITAL_COMMUNITY)
Admit: 2013-08-27 | Discharge: 2013-08-27 | Disposition: A | Discharge status: Home / Self Care | Payer: Medicare PPO | Attending: Cardiology | Admitting: Cardiology

## 2013-08-27 ENCOUNTER — Inpatient Hospital Stay (HOSPITAL_COMMUNITY)
Admit: 2013-08-27 | Discharge: 2013-08-27 | Disposition: A | Discharge status: Home / Self Care | Payer: Medicare PPO | Attending: Cardiology | Admitting: Cardiology

## 2013-08-27 ENCOUNTER — Other Ambulatory Visit: Payer: Self-pay

## 2013-08-27 LAB — CBC
HEMATOCRIT: 39.6 % (ref 39.0–52.0)
Hemoglobin: 13.1 g/dL (ref 13.0–17.0)
MCH: 31.3 pg (ref 26.0–34.0)
MCHC: 33.1 g/dL (ref 30.0–36.0)
MCV: 94.7 fL (ref 78.0–100.0)
Platelets: 297 10*3/uL (ref 150–400)
RBC: 4.18 MIL/uL — ABNORMAL LOW (ref 4.22–5.81)
RDW: 14.6 % (ref 11.5–15.5)
WBC: 6.3 10*3/uL (ref 4.0–10.5)

## 2013-08-27 LAB — HEPARIN LEVEL (UNFRACTIONATED)
Heparin Unfractionated: 0.35 IU/mL (ref 0.30–0.70)
Heparin Unfractionated: 0.41 IU/mL (ref 0.30–0.70)

## 2013-08-27 MED ORDER — DILTIAZEM HCL ER COATED BEADS 120 MG PO CP24
120.0000 mg | ORAL_CAPSULE | Freq: Every day | ORAL | Status: DC
  Administered 2013-08-27 – 2013-08-31 (×5): 120 mg via ORAL
  Filled 2013-08-27 (×5): qty 1

## 2013-08-27 MED ORDER — TECHNETIUM TC 99M SESTAMIBI - CARDIOLITE
30.0000 | Freq: Once | INTRAVENOUS | Status: AC | PRN
  Administered 2013-08-27: 30 via INTRAVENOUS

## 2013-08-27 MED ORDER — TECHNETIUM TC 99M SESTAMIBI GENERIC - CARDIOLITE
10.0000 | Freq: Once | INTRAVENOUS | Status: AC | PRN
  Administered 2013-08-27: 10 via INTRAVENOUS

## 2013-08-27 MED ORDER — REGADENOSON 0.4 MG/5ML IV SOLN
INTRAVENOUS | Status: AC
  Filled 2013-08-27: qty 5

## 2013-08-27 MED ORDER — HEPARIN (PORCINE) IN NACL 100-0.45 UNIT/ML-% IJ SOLN
1100.0000 [IU]/h | INTRAMUSCULAR | Status: DC
  Administered 2013-08-27 – 2013-08-29 (×3): 1100 [IU]/h via INTRAVENOUS
  Filled 2013-08-27 (×6): qty 250

## 2013-08-27 MED ORDER — REGADENOSON 0.4 MG/5ML IV SOLN
0.4000 mg | Freq: Once | INTRAVENOUS | Status: AC
  Administered 2013-08-27: 0.4 mg via INTRAVENOUS

## 2013-08-27 NOTE — Progress Notes (Signed)
ANTICOAGULATION CONSULT NOTE - Follow Up Consult  Pharmacy Consult for Heparin Indication: atrial fibrillation  No Known Allergies  Patient Measurements: Height: 5\' 5"  (165.1 cm) Weight: 154 lb 6.4 oz (70.035 kg) IBW/kg (Calculated) : 61.5 Heparin Dosing Weight:   Vital Signs: Temp: 98.1 F (36.7 C) (05/30 2026) BP: 123/73 mmHg (05/30 2026) Pulse Rate: 109 (05/30 2026)  Labs:  Recent Labs  08/24/13 1215 08/26/13 0408 08/26/13 1356 08/26/13 2300  HGB  --   --  13.6  --   HCT  --   --  40.4  --   PLT  --   --  281  --   APTT  --   --  36  --   HEPARINUNFRC  --   --   --  0.31  CREATININE 0.53 0.52  --   --     Estimated Creatinine Clearance: 60.9 ml/min (by C-G formula based on Cr of 0.52).   Medications:  Infusions:  . heparin 1,100 Units/hr (08/27/13 0144)    Assessment: Patient with heparin level at goal but lower end of goal.  No issues per RN.  Goal of Therapy:  Heparin level 0.3-0.7 units/ml Monitor platelets by anticoagulation protocol: Yes   Plan:  Increase heparin to 1100 units/hr, to keep within goal range. Recheck level at 1000  [employer]. 08/27/2013,1:45 AM

## 2013-08-27 NOTE — Progress Notes (Signed)
Patient back from [place] lexiscan; alert and oriented, denies any pain/distress. In bed resting, will continue to assess patient.

## 2013-08-27 NOTE — Progress Notes (Signed)
Patient had an isolated 2.35 pause. Patient in no signs of distress, NP on call made aware.

## 2013-08-27 NOTE — Progress Notes (Signed)
Subjective:  Patient denies any chest pain or shortness of breath states overall feels better. Occasional pauses noted up to 2.5 second asymptomatic  Objective:  Vital Signs in the last 24 hours: Temp:  [97.7 F (36.5 C)-98.2 F (36.8 C)] 97.7 F (36.5 C) (05/31 0513) Pulse Rate:  [61-120] 82 (05/31 1017) Resp:  [18-24] 20 (05/31 1017) BP: (98-177)/(63-98) 104/67 mmHg (05/31 1017) SpO2:  [93 %-100 %] 93 % (05/31 0804)  Intake/Output from previous day: 05/30 0701 - 05/31 0700 In: 360 [P.O.:360] Out: -  Intake/Output from this shift: Total I/O In: 0  Out: 100 [Urine:100]  Physical Exam: Neck: no adenopathy, no carotid bruit, no JVD and supple, symmetrical, trachea midline Lungs: Decreased breath sound at bases Heart: irregularly irregular rhythm, S1, S2 normal and Soft systolic murmur noted Abdomen: soft, non-tender; bowel sounds normal; no masses,  no organomegaly Extremities: extremities normal, atraumatic, no cyanosis or edema  Lab Results:  Recent Labs  08/26/13 1356 08/27/13 0430  WBC 7.0 6.3  HGB 13.6 13.1  PLT 281 297    Recent Labs  08/24/13 1215 08/26/13 0408  NA 137 133*  K 3.3* 4.5  CL 100 101  CO2 25 25  GLUCOSE 86 90  BUN 8 9  CREATININE 0.53 0.52   No results found for this basename: TROPONINI, CK, MB,  in the last 72 hours Hepatic Function Panel No results found for this basename: PROT, ALBUMIN, AST, ALT, ALKPHOS, BILITOT, BILIDIR, IBILI,  in the last 72 hours No results found for this basename: CHOL,  in the last 72 hours No results found for this basename: PROTIME,  in the last 72 hours  Imaging: Imaging results have been reviewed and No results found.  Cardiac Studies:  Assessment/Plan:  Atrial fibrillation with controlled ventricular response  Exertional dyspnea/generalized weakness tiredness probably secondary to above rule out coronary insufficiency  Hypertension  Hypercholesteremia Plan Reduce Cardizem dose as per  orders Scheduled for negative stress test today  LOS: 5 days    Robynn Pane 08/27/2013, 11:55 AM

## 2013-08-27 NOTE — Progress Notes (Signed)
ANTICOAGULATION CONSULT NOTE - Follow Up Consult  Pharmacy Consult for Heparin Indication: atrial fibrillation  No Known Allergies  Patient Measurements: Height: 5\' 5"  (165.1 cm) Weight: 154 lb 6.4 oz (70.035 kg) IBW/kg (Calculated) : 61.5 Heparin Dosing Weight: 61.5 kg  Vital Signs: Temp: 97.7 F (36.5 C) (05/31 0513) Temp src: Oral (05/31 0804) BP: 135/89 mmHg (05/31 0804) Pulse Rate: 108 (05/31 0815)  Labs:  Recent Labs  08/24/13 1215 08/26/13 0408 08/26/13 1356 08/26/13 2300 08/27/13 0430  HGB  --   --  13.6  --  13.1  HCT  --   --  40.4  --  39.6  PLT  --   --  281  --  297  APTT  --   --  36  --   --   HEPARINUNFRC  --   --   --  0.31  --   CREATININE 0.53 0.52  --   --   --     Estimated Creatinine Clearance: 60.9 ml/min (by C-G formula based on Cr of 0.52).   Medications:  Infusions:  . heparin 1,100 Units/hr (08/27/13 0827)    Assessment: 80 yo male admitted 5/26 noted to be in Afib on 5/30 to start IV heparin per pharmacy per cards order. PMH includes HTN, chronic afib - states was on warfarin in past (stopped b/c he ddin't want to take anticoagulant), then on Pradaxa (stopped d/t kidney problems), currently not on anticoagulants PTA.  Pharmacy consulted to dose IV heparin.   Baseline APTT 36, no baseline INR.  SCr 0.52 with CrCl ~ 61 ml/min  CBC:  Hgb 13.1 and Plt 297.  Stable and WNL.  Pt transferred to [facility] 5/31 AM for Lexiscan.  No bleeding or complications reported.  New allergies to warfarin (upset stomach) and dabigatran (SOB, nausea, sleplessness) added, but not true allergies.  F/u long-term anticoag plans.  Noted pt is on Saw Palmet[employer] Herbal supplement PTA.  There is a theoretical risk of increased bleeding if combined with antiplatelet drugs or anticoagulants.  Address prior to discharge.  Heparin Level = 0.35, therapeutic on heparin 1100 units/hr.  Goal of Therapy:  Heparin level 0.3-0.7 units/ml Monitor platelets by anticoagulation  protocol: Yes   Plan:   Continue heparin IV infusion at 1100 units/hr  Heparin level in 8 hours to confirm therapeutic level.  Daily heparin level and CBC  Continue to monitor H&H and platelets   Lynann Beaver PharmD, BCPS Pager [phone] 08/27/2013 12:51 PM

## 2013-08-27 NOTE — Progress Notes (Signed)
[facility] PROGRESS NOTE  Ramond CraverRobert Sotelo WJX:[id number] DOB: February 17, 1928 DOA: 08/22/2013 PCP: No primary provider on file.  Assessment/Plan: Principal Problem:   Atrial fibrillation with RVR - Cardiology currently managing  Hypomagnasemia - Resolved after IV replacement and within normal limits.  Hypokalemia - replaced orally and within normal limits currently with last value at 4.5  Code Status: full Family Communication: No family at bedside. Case discussed directly with patient Disposition: Awaiting further workup and recommendations by cardiologist  Consultants:  Cardiology: Dr. Sharyn LullHarwani  Procedures:  None  Antibiotics:  None  HPI/Subjective: No new complaints. No acute issues reported overnight  Objective: Filed Vitals:   08/27/13 1211  BP: 125/90  Pulse: 89  Temp: 97.6 F (36.4 C)  Resp: 22    Intake/Output Summary (Last 24 hours) at 08/27/13 1555 Last data filed at 08/27/13 0806  Gross per 24 hour  Intake      0 ml  Output    100 ml  Net   -100 ml   Filed Weights   08/23/13 0200 08/24/13 1558  Weight: 66 kg (145 lb 8.1 oz) 70.035 kg (154 lb 6.4 oz)    Exam:   General:  Pt in NAD, alert and awake  Cardiovascular: irregularly irregular, no rubs  Respiratory: CTA BL, no wheezes  Abdomen: soft, NT, ND  Musculoskeletal: no cyanosis or clubbing   Data Reviewed: Basic Metabolic Panel:  Recent Labs Lab 08/22/13 2234 08/24/13 1215 08/25/13 0409 08/26/13 0408  NA 136* 137  --  133*  K 4.1 3.3*  --  4.5  CL 96 100  --  101  CO2 28 25  --  25  GLUCOSE 102* 86  --  90  BUN 9 8  --  9  CREATININE 0.51 0.53  --  0.52  CALCIUM 8.7 7.7*  --  7.9*  MG  --  1.4* 1.8 1.8  PHOS  --  2.4  --   --    Liver Function Tests: No results found for this basename: AST, ALT, ALKPHOS, BILITOT, PROT, ALBUMIN,  in the last 168 hours No results found for this basename: LIPASE, AMYLASE,  in the last 168 hours No results found for this basename:  AMMONIA,  in the last 168 hours CBC:  Recent Labs Lab 08/22/13 2234 08/26/13 1356 08/27/13 0430  WBC 6.0 7.0 6.3  NEUTROABS 3.8  --   --   HGB 15.3 13.6 13.1  HCT 44.5 40.4 39.6  MCV 92.7 93.5 94.7  PLT 213 281 297   Cardiac Enzymes: No results found for this basename: CKTOTAL, CKMB, CKMBINDEX, TROPONINI,  in the last 168 hours BNP (last 3 results) No results found for this basename: PROBNP,  in the last 8760 hours CBG: No results found for this basename: GLUCAP,  in the last 168 hours  Recent Results (from the past 240 hour(s))  MRSA PCR SCREENING     Status: None   Collection Time    08/23/13  1:19 AM      Result Value Ref Range Status   MRSA by PCR NEGATIVE  NEGATIVE Final   Comment:            The GeneXpert MRSA Assay (FDA     approved for NASAL specimens     only), is one component of a     comprehensive MRSA colonization     surveillance program. It is not     intended to diagnose MRSA     infection nor to guide  or     monitor treatment for     MRSA infections.     Studies: Nm Myocar Multi W/spect W/wall Motion / Ef  08/27/2013   CLINICAL DATA:  Chest pain, history of atrial fibrillation with RVR  EXAM: MYOCARDIAL IMAGING WITH SPECT (REST AND EXERCISE)  GATED LEFT VENTRICULAR WALL MOTION STUDY  LEFT VENTRICULAR EJECTION FRACTION  TECHNIQUE: Standard myocardial SPECT imaging was performed after resting intravenous injection of 10 mCi Tc-38m sestamibi. Subsequently, exercise tolerance test was performed by the patient under the supervision of the Cardiology staff. At peak-stress, 30 mCi Tc-83m sestamibi was injected intravenously and standard myocardial SPECT imaging was performed. Quantitative gated imaging was also performed to evaluate left ventricular wall motion, and estimate left ventricular ejection fraction.  COMPARISON:  None.  FINDINGS: The stress SPECT images demonstrate a small, reversible perfusion defect in the apex. Sum difference score 7. Rest images  demonstrate no perfusion defects.  The gated stress SPECT images demonstrate normal left ventricular myocardial thickening. Equivocal mild wall motion abnormality involving the apex.  Calculated left ventricular end-diastolic volume 66 mL, end-systolic volume 33 mL, ejection fraction of 50%.  IMPRESSION: 1. Suspected small, reversible perfusion defect in the apex, suspicious for pharmacologic-stress induced ischemia.  2. Left ventricular ejection fraction 50%.  These results were called by telephone at the time of interpretation on 08/27/2013 at 1:02 PM to Dr. Rinaldo Cloud , who verbally acknowledged these results.   Electronically Signed   By: Charline Bills M.D.   On: 08/27/2013 13:06    Scheduled Meds: . carvedilol  3.125 mg Oral BID WC  . diltiazem  120 mg Oral Daily  . lisinopril  2.5 mg Oral Daily   Continuous Infusions: . heparin 1,100 Units/hr (08/27/13 0827)     Time spent: > 35 minutes    Penny Pia  [facility] Pager [phone]. If 7PM-7AM, please contact night-coverage at [email or web address], password [facility] 08/27/2013, 3:55 PM  LOS: 5 days

## 2013-08-28 ENCOUNTER — Other Ambulatory Visit: Payer: Self-pay

## 2013-08-28 LAB — CBC
HEMATOCRIT: 36.3 % — AB (ref 39.0–52.0)
Hemoglobin: 12.2 g/dL — ABNORMAL LOW (ref 13.0–17.0)
MCH: 31.7 pg (ref 26.0–34.0)
MCHC: 33.6 g/dL (ref 30.0–36.0)
MCV: 94.3 fL (ref 78.0–100.0)
PLATELETS: 310 10*3/uL (ref 150–400)
RBC: 3.85 MIL/uL — ABNORMAL LOW (ref 4.22–5.81)
RDW: 14.5 % (ref 11.5–15.5)
WBC: 4.4 10*3/uL (ref 4.0–10.5)

## 2013-08-28 LAB — HEPARIN LEVEL (UNFRACTIONATED): Heparin Unfractionated: 0.47 IU/mL (ref 0.30–0.70)

## 2013-08-28 MED ORDER — ASPIRIN 81 MG PO CHEW
81.0000 mg | CHEWABLE_TABLET | Freq: Every day | ORAL | Status: DC
  Administered 2013-08-29 – 2013-08-31 (×3): 81 mg via ORAL
  Filled 2013-08-28: qty 1

## 2013-08-28 MED ORDER — ASPIRIN 81 MG PO CHEW
324.0000 mg | CHEWABLE_TABLET | Freq: Once | ORAL | Status: AC
  Administered 2013-08-28: 324 mg via ORAL
  Filled 2013-08-28: qty 4

## 2013-08-28 NOTE — Progress Notes (Signed)
Physical Therapy Treatment Patient Details Name: Vincent Benjamin MRN: [id number] DOB: 1927-08-13 Today's Date: 08/28/2013    History of Present Illness Pt is an 80 year old male visiting his daughter (in SNF) from [place] and presented to ED with back pain felt to most likely be musculoskeletal; however, during his work up his heart rate was noted to be in the 160s and found to be in A.Fib with RVR.      PT Comments    Pt ambulated good distance in hallway with RW, continues to report R knee pain with antalgic gait.  Follow Up Recommendations  Home health PT     Equipment Recommendations  Rolling walker with 5" wheels    Recommendations for Other Services       Precautions / Restrictions Precautions Precautions: Fall Restrictions Weight Bearing Restrictions: No    Mobility  Bed Mobility   Bed Mobility: Sit to Supine       Sit to supine: Supervision   General bed mobility comments: pt up in recliner on arrival, difficulty lifting LEs onto bed however no physical assist required  Transfers Overall transfer level: Needs assistance Equipment used: Rolling walker (2 wheeled) Transfers: Sit to/from Stand Sit to Stand: Supervision Stand pivot transfers: Supervision       General transfer comment: verbal cues for safety  Ambulation/Gait Ambulation/Gait assistance: Min guard Ambulation Distance (Feet): 400 Feet Assistive device: Rolling walker (2 wheeled) Gait Pattern/deviations: Decreased stride length;Antalgic     General Gait Details: continues to present with antalgic gait due to increased R knee pain, HR 113-130 during ambulation, one standing rest break required due to fatigue and pain however pt reports feeling overall better then last session   Stairs            Wheelchair Mobility    Modified Rankin (Stroke Patients Only)       Balance                                    Cognition Arousal/Alertness: Awake/alert Behavior During  Therapy: WFL for tasks assessed/performed Overall Cognitive Status: Within Functional Limits for tasks assessed                      Exercises Total Joint Exercises Ankle Circles/Pumps: AROM;Both;5 reps Heel Slides: AROM;Both;10 reps;Supine Straight Leg Raises: AROM;Both;10 reps;Supine    General Comments        Pertinent Vitals/Pain R knee pain not rated, improved since last session, activity to tolerance, RN in room end of session    Home Living                      Prior Function            PT Goals (current goals can now be found in the care plan section) Progress towards PT goals: Progressing toward goals    Frequency  Min 3X/week    PT Plan Current plan remains appropriate    Co-evaluation             End of Session   Activity Tolerance: Patient limited by fatigue;Patient limited by pain Patient left: in bed;with call bell/phone within reach;with nursing/sitter in room     Time: 0921-0946 PT Time Calculation (min): 25 min  Charges:  $Gait Training: 23-37 mins  G CodesLynnell Catalan:      Jaciel Diem E Emmert Roethler 08/28/2013, 10:00 AM Zenovia JarredKati Wandalee Klang, PT, DPT 08/28/2013 Pager: [phone]

## 2013-08-28 NOTE — Progress Notes (Signed)
Talked to patient about physical therapy recommendation for home health PT; patient is not sure at this time if he wants it and wants to know the copay; benefit check in progress; CM to follow up - patient lives in [place] and is her visiting his daughter that resides in a nursing facility; patient plans to return home at discharge; Alexis Goodell [phone]

## 2013-08-28 NOTE — Progress Notes (Signed)
UR COMPLETED  

## 2013-08-28 NOTE — Progress Notes (Addendum)
[facility] PROGRESS NOTE  Ramond CraverRobert Sanjurjo WNU:[id number] DOB: 1927-08-13 DOA: 08/22/2013 PCP: No primary provider on file.  Assessment/Plan: Principal Problem:   Atrial fibrillation with RVR - Cardiology currently managing. Patient currently on Cardizem and carvedilol - pt is s/p myoview Addendum: Patient is currently on heparin for anticoagulation  Hypomagnasemia - Resolved after IV replacement and within normal limits.  Hypokalemia - replaced orally and within normal limits currently with last value at 4.5  Code Status: full Family Communication: No family at bedside. Case discussed directly with patient Disposition: Awaiting further workup and recommendations by cardiologist  Consultants:  Cardiology: Dr. Sharyn LullHarwani  Procedures:  None  Antibiotics:  None  HPI/Subjective: No new complaints. No acute issues reported overnight  Objective: Filed Vitals:   08/28/13 1628  BP: 116/78  Pulse: 80  Temp:   Resp:     Intake/Output Summary (Last 24 hours) at 08/28/13 1730 Last data filed at 08/28/13 1000  Gross per 24 hour  Intake    428 ml  Output    700 ml  Net   -272 ml   Filed Weights   08/23/13 0200 08/24/13 1558  Weight: 66 kg (145 lb 8.1 oz) 70.035 kg (154 lb 6.4 oz)    Exam:   General:  Pt in NAD, alert and awake  Cardiovascular: irregularly irregular, no rubs  Respiratory: CTA BL, no wheezes  Abdomen: soft, NT, ND  Musculoskeletal: no cyanosis or clubbing   Data Reviewed: Basic Metabolic Panel:  Recent Labs Lab 08/22/13 2234 08/24/13 1215 08/25/13 0409 08/26/13 0408  NA 136* 137  --  133*  K 4.1 3.3*  --  4.5  CL 96 100  --  101  CO2 28 25  --  25  GLUCOSE 102* 86  --  90  BUN 9 8  --  9  CREATININE 0.51 0.53  --  0.52  CALCIUM 8.7 7.7*  --  7.9*  MG  --  1.4* 1.8 1.8  PHOS  --  2.4  --   --    Liver Function Tests: No results found for this basename: AST, ALT, ALKPHOS, BILITOT, PROT, ALBUMIN,  in the last 168 hours No  results found for this basename: LIPASE, AMYLASE,  in the last 168 hours No results found for this basename: AMMONIA,  in the last 168 hours CBC:  Recent Labs Lab 08/22/13 2234 08/26/13 1356 08/27/13 0430 08/28/13 0453  WBC 6.0 7.0 6.3 4.4  NEUTROABS 3.8  --   --   --   HGB 15.3 13.6 13.1 12.2*  HCT 44.5 40.4 39.6 36.3*  MCV 92.7 93.5 94.7 94.3  PLT 213 281 297 310   Cardiac Enzymes: No results found for this basename: CKTOTAL, CKMB, CKMBINDEX, TROPONINI,  in the last 168 hours BNP (last 3 results) No results found for this basename: PROBNP,  in the last 8760 hours CBG: No results found for this basename: GLUCAP,  in the last 168 hours  Recent Results (from the past 240 hour(s))  MRSA PCR SCREENING     Status: None   Collection Time    08/23/13  1:19 AM      Result Value Ref Range Status   MRSA by PCR NEGATIVE  NEGATIVE Final   Comment:            The GeneXpert MRSA Assay (FDA     approved for NASAL specimens     only), is one component of a     comprehensive MRSA colonization  surveillance program. It is not     intended to diagnose MRSA     infection nor to guide or     monitor treatment for     MRSA infections.     Studies: Nm Myocar Multi W/spect W/wall Motion / Ef  08/27/2013   CLINICAL DATA:  Chest pain, history of atrial fibrillation with RVR  EXAM: MYOCARDIAL IMAGING WITH SPECT (REST AND EXERCISE)  GATED LEFT VENTRICULAR WALL MOTION STUDY  LEFT VENTRICULAR EJECTION FRACTION  TECHNIQUE: Standard myocardial SPECT imaging was performed after resting intravenous injection of 10 mCi Tc-66m sestamibi. Subsequently, exercise tolerance test was performed by the patient under the supervision of the Cardiology staff. At peak-stress, 30 mCi Tc-41m sestamibi was injected intravenously and standard myocardial SPECT imaging was performed. Quantitative gated imaging was also performed to evaluate left ventricular wall motion, and estimate left ventricular ejection fraction.   COMPARISON:  None.  FINDINGS: The stress SPECT images demonstrate a small, reversible perfusion defect in the apex. Sum difference score 7. Rest images demonstrate no perfusion defects.  The gated stress SPECT images demonstrate normal left ventricular myocardial thickening. Equivocal mild wall motion abnormality involving the apex.  Calculated left ventricular end-diastolic volume 66 mL, end-systolic volume 33 mL, ejection fraction of 50%.  IMPRESSION: 1. Suspected small, reversible perfusion defect in the apex, suspicious for pharmacologic-stress induced ischemia.  2. Left ventricular ejection fraction 50%.  These results were called by telephone at the time of interpretation on 08/27/2013 at 1:02 PM to Dr. Rinaldo Cloud , who verbally acknowledged these results.   Electronically Signed   By: Charline Bills M.D.   On: 08/27/2013 13:06    Scheduled Meds: . carvedilol  3.125 mg Oral BID WC  . diltiazem  120 mg Oral Daily  . lisinopril  2.5 mg Oral Daily   Continuous Infusions: . heparin 1,100 Units/hr (08/28/13 3875)     Time spent: > 35 minutes    Penny Pia  [facility] Pager [phone]. If 7PM-7AM, please contact night-coverage at [email or web address], password [facility] 08/28/2013, 5:30 PM  LOS: 6 days

## 2013-08-28 NOTE — Progress Notes (Signed)
Subjective:  Patient denies any chest pain complains of shortness of breath with minimal exertion. Patient underwent Lexiscan Myoview yesterday which showed small area of reversible ischemia in the apex with EF of 50%.   Objective:  Vital Signs in the last 24 hours: Temp:  [97.3 F (36.3 C)-98.2 F (36.8 C)] 97.4 F (36.3 C) (06/01 1526) Pulse Rate:  [77-108] 80 (06/01 1628) Resp:  [19-20] 20 (06/01 1526) BP: (91-118)/(55-80) 116/78 mmHg (06/01 1628) SpO2:  [97 %-100 %] 100 % (06/01 1526)  Intake/Output from previous day: 05/31 0701 - 06/01 0700 In: 504 [P.O.:240; I.V.:264] Out: 800 [Urine:800] Intake/Output from this shift: Total I/O In: 44 [I.V.:44] Out: 200 [Urine:200]  Physical Exam: Neck: no adenopathy, no carotid bruit, no JVD and supple, symmetrical, trachea midline Lungs: clear to auscultation bilaterally Heart: irregularly irregular rhythm, S1, S2 normal and Soft systolic murmur noted Abdomen: soft, non-tender; bowel sounds normal; no masses,  no organomegaly Extremities: extremities normal, atraumatic, no cyanosis or edema  Lab Results:  Recent Labs  08/27/13 0430 08/28/13 0453  WBC 6.3 4.4  HGB 13.1 12.2*  PLT 297 310    Recent Labs  08/26/13 0408  NA 133*  K 4.5  CL 101  CO2 25  GLUCOSE 90  BUN 9  CREATININE 0.52   No results found for this basename: TROPONINI, CK, MB,  in the last 72 hours Hepatic Function Panel No results found for this basename: PROT, ALBUMIN, AST, ALT, ALKPHOS, BILITOT, BILIDIR, IBILI,  in the last 72 hours No results found for this basename: CHOL,  in the last 72 hours No results found for this basename: PROTIME,  in the last 72 hours  Imaging: Imaging results have been reviewed and Nm Myocar Multi W/spect W/wall Motion / Ef  08/27/2013   CLINICAL DATA:  Chest pain, history of atrial fibrillation with RVR  EXAM: MYOCARDIAL IMAGING WITH SPECT (REST AND EXERCISE)  GATED LEFT VENTRICULAR WALL MOTION STUDY  LEFT VENTRICULAR  EJECTION FRACTION  TECHNIQUE: Standard myocardial SPECT imaging was performed after resting intravenous injection of 10 mCi Tc-58m sestamibi. Subsequently, exercise tolerance test was performed by the patient under the supervision of the Cardiology staff. At peak-stress, 30 mCi Tc-31m sestamibi was injected intravenously and standard myocardial SPECT imaging was performed. Quantitative gated imaging was also performed to evaluate left ventricular wall motion, and estimate left ventricular ejection fraction.  COMPARISON:  None.  FINDINGS: The stress SPECT images demonstrate a small, reversible perfusion defect in the apex. Sum difference score 7. Rest images demonstrate no perfusion defects.  The gated stress SPECT images demonstrate normal left ventricular myocardial thickening. Equivocal mild wall motion abnormality involving the apex.  Calculated left ventricular end-diastolic volume 66 mL, end-systolic volume 33 mL, ejection fraction of 50%.  IMPRESSION: 1. Suspected small, reversible perfusion defect in the apex, suspicious for pharmacologic-stress induced ischemia.  2. Left ventricular ejection fraction 50%.  These results were called by telephone at the time of interpretation on 08/27/2013 at 1:02 PM to Dr. Rinaldo Cloud , who verbally acknowledged these results.   Electronically Signed   By: Charline Bills M.D.   On: 08/27/2013 13:06    Cardiac Studies:  Assessment/Plan:  Exertional dyspnea/generalized weakness probably angina equivalent mildly positive nuclear stress test rule out coronary insufficiency Chronic atrial fibrillation Hypertension Hypercholesteremia Plan Discussed with patient at length various options of treatment i.e. medical as patient stress test is minimally abnormal versus invasive left cath possible PTCA stenting its risk and benefits i.e. death MI stroke  need for emergency CABG local vessel complications bleeding infection etc. and consented for PCI. Patient tentatively  scheduled for first case in a.m. Will ask Dr. Vega to possibly transfer patient to [facility] hospital telemetry today.  LOS: 6 days    Everli Rother N Drelyn Pistilli 08/28/2013, 6:12 PM    

## 2013-08-28 NOTE — Progress Notes (Signed)
Talked to patient again about possible HHPT at discharge; pt stated" I don't think that I need that". Patient plans to go back to [place], [place] the day of discharge; CM informed patient that if he changed his mind his PCP can make arrangements for home therapy after discharge; Alexis Goodell [phone]

## 2013-08-28 NOTE — Progress Notes (Signed)
ANTICOAGULATION CONSULT NOTE - Follow Up Consult  Pharmacy Consult for Heparin Indication: atrial fibrillation  Allergies  Allergen Reactions  . Coumadin [Warfarin Sodium]     Stomach upset  . Pradaxa [Dabigatran Etexilate Mesylate]     SOB, sleeplessness, nausea    Patient Measurements: Height: 5\' 5"  (165.1 cm) Weight: 154 lb 6.4 oz (70.035 kg) IBW/kg (Calculated) : 61.5 Heparin Dosing Weight:   Vital Signs: Temp: 98.2 F (36.8 C) (05/31 2045) Temp src: Oral (05/31 2045) BP: 92/62 mmHg (05/31 2045) Pulse Rate: 80 (05/31 2045)  Labs:  Recent Labs  08/26/13 0408 08/26/13 1356 08/26/13 2300 08/27/13 0430 08/27/13 1216 08/27/13 2109  HGB  --  13.6  --  13.1  --   --   HCT  --  40.4  --  39.6  --   --   PLT  --  281  --  297  --   --   APTT  --  36  --   --   --   --   HEPARINUNFRC  --   --  0.31  --  0.35 0.41  CREATININE 0.52  --   --   --   --   --     Estimated Creatinine Clearance: 60.9 ml/min (by C-G formula based on Cr of 0.52).   Medications:  Infusions:  . heparin 1,100 Units/hr (08/27/13 0827)    Assessment: Patient with heparin level at goal x2.  No issues per RN.  Goal of Therapy:  Heparin level 0.3-0.7 units/ml Monitor platelets by anticoagulation protocol: Yes   Plan:  Continue heparin drip at current rate Recheck level  With am labs  [employer]. 08/28/2013,2:12 AM

## 2013-08-29 ENCOUNTER — Other Ambulatory Visit: Payer: Self-pay

## 2013-08-29 ENCOUNTER — Encounter (HOSPITAL_COMMUNITY)
Admission: EM | Disposition: A | Discharge status: Home / Self Care | Payer: Medicare PPO | Source: Home / Self Care | Attending: Family Medicine

## 2013-08-29 DIAGNOSIS — E876 Hypokalemia: Secondary | ICD-10-CM

## 2013-08-29 DIAGNOSIS — I10 Essential (primary) hypertension: Secondary | ICD-10-CM

## 2013-08-29 HISTORY — PX: LEFT HEART CATHETERIZATION WITH CORONARY ANGIOGRAM: SHX5451

## 2013-08-29 LAB — PROTIME-INR
INR: 1.11 (ref 0.00–1.49)
PROTHROMBIN TIME: 14.1 s (ref 11.6–15.2)

## 2013-08-29 LAB — POCT ACTIVATED CLOTTING TIME: Activated Clotting Time: 171 seconds

## 2013-08-29 SURGERY — LEFT HEART CATHETERIZATION WITH CORONARY ANGIOGRAM
Anesthesia: LOCAL

## 2013-08-29 MED ORDER — SODIUM CHLORIDE 0.9 % IV SOLN
1.0000 mL/kg/h | INTRAVENOUS | Status: DC
  Administered 2013-08-29: 1 mL/kg/h via INTRAVENOUS

## 2013-08-29 MED ORDER — SODIUM CHLORIDE 0.9 % IJ SOLN
3.0000 mL | INTRAMUSCULAR | Status: DC | PRN
Start: 2013-08-29 — End: 2013-08-29

## 2013-08-29 MED ORDER — LIDOCAINE HCL (PF) 1 % IJ SOLN
INTRAMUSCULAR | Status: AC
  Filled 2013-08-29: qty 30

## 2013-08-29 MED ORDER — SODIUM CHLORIDE 0.9 % IV SOLN: 250.0000 mL | INTRAVENOUS | Status: DC | PRN

## 2013-08-29 MED ORDER — HEPARIN SODIUM (PORCINE) 1000 UNIT/ML IJ SOLN
INTRAMUSCULAR | Status: AC
  Filled 2013-08-29: qty 1

## 2013-08-29 MED ORDER — SODIUM CHLORIDE 0.9 % IV SOLN: INTRAVENOUS | Status: AC

## 2013-08-29 MED ORDER — APIXABAN 5 MG PO TABS
5.0000 mg | ORAL_TABLET | Freq: Two times a day (BID) | ORAL | Status: DC
  Administered 2013-08-29 – 2013-08-31 (×4): 5 mg via ORAL
  Filled 2013-08-29 (×5): qty 1

## 2013-08-29 MED ORDER — FENTANYL CITRATE 0.05 MG/ML IJ SOLN
INTRAMUSCULAR | Status: AC
Start: 2013-08-29 — End: 2013-08-29
  Filled 2013-08-29: qty 2

## 2013-08-29 MED ORDER — SODIUM CHLORIDE 0.9 % IV SOLN: 1.0000 mL/kg/h | INTRAVENOUS | Status: DC

## 2013-08-29 MED ORDER — HEPARIN (PORCINE) IN NACL 2-0.9 UNIT/ML-% IJ SOLN
INTRAMUSCULAR | Status: AC
  Filled 2013-08-29: qty 1000

## 2013-08-29 MED ORDER — MIDAZOLAM HCL 2 MG/2ML IJ SOLN
INTRAMUSCULAR | Status: AC
  Filled 2013-08-29: qty 2

## 2013-08-29 MED ORDER — ASPIRIN 81 MG PO CHEW
81.0000 mg | CHEWABLE_TABLET | ORAL | Status: AC
  Administered 2013-08-29: 81 mg via ORAL
  Filled 2013-08-29: qty 1

## 2013-08-29 MED ORDER — SODIUM CHLORIDE 0.9 % IJ SOLN: 3.0000 mL | Freq: Two times a day (BID) | INTRAMUSCULAR | Status: DC

## 2013-08-29 NOTE — Cardiovascular Report (Signed)
[name]:  [name], [name]               ACCOUNT NO.:  [id number]  MEDICAL RECORD NO.:  [id number]  LOCATION:  [facility]                         FACILITY:  [facility]  PHYSICIAN:  Eduardo OsierMohan N. Sharyn LullHarwani, M.D. DATE OF BIRTH:  [birth year]  DATE OF PROCEDURE:  08/29/2013 DATE OF DISCHARGE:                           CARDIAC CATHETERIZATION   PROCEDURE:  Left cardiac cath with selective left and right coronary angiography, LV graphy via right groin using Judkins technique.  INDICATION FOR THE PROCEDURE:  Mr. Vincent Benjamin is 80 year old male with past medical history significant for hypertension, chronic atrial fibrillation.  He is resident of [place], was admitted because of recurrent fall and muscle spasm in the legs and was noted to be in AFib with RVR.  The patient denies any chest pain, but complains of feeling tired, short of breath, weak with minimal exertion.  Denies any palpitation, lightheadedness, or syncope, states he was on warfarin in the past for atrial fibrillation, but he stopped and do not want to take any anticoagulants.  The patient also states, he was on Pradaxa for sometime, but stopped due to kidney problems.  This patient denies any history of bleeding in the past.  Denies any cardiac workup.  Denies stress test or cardiac catheterization in the past.  Due to exertional dyspnea, feeling tired, fatigued, minor ST-T wave changes on the EKG. Patient subsequently underwent Lexiscan Myoview which showed small area of reversible perfusion defect in the apex with EF of 50%.  Due to multiple risk factors, mild ST-T wave changes in the anterior leads and mildly abnormal Lexiscan Myoview and exertional dyspnea and probably angina equivalent.  Discussed with patient various options of treatment, i.e., medical versus left cath, possible PTCA stenting, its risks and benefits, i.e., death, MI, stroke, need for emergency CABG, local vascular complications, etc. and consented for PCI.  DESCRIPTION OF  PROCEDURE:  After obtaining the informed consent, patient was brought to the cath lab and was placed on fluoroscopy table.  Right groin was prepped and draped in usual fashion.  A 1% Xylocaine was used for local anesthesia in the right groin.  With the help of thin wall needle, 5-French arterial sheath was placed.  The sheath was aspirated and flushed.  Next, 5-French left Judkins catheter was advanced over the wire under fluoroscopic guidance up to the ascending aorta.  Wire was pulled out.  The catheter was aspirated and connected to the Manifold. Catheter was further advanced and engaged into left coronary ostium. Multiple views of the left system were taken.  Next, catheter was disengaged and was pulled out over the wire and was replaced with 5- [place] right Judkins catheter, which was advanced over the wire under fluoroscopic guidance up to the ascending aorta.  Wire was pulled out. The catheter was aspirated and connected to the Manifold.  Catheter was further advanced and engaged into right coronary ostium.  Multiple views of the right system were taken.  Next, catheter was disengaged and was pulled out over the wire and was replaced with 5-French pigtail catheter, which was advanced over the wire under fluoroscopic guidance up to the ascending aorta.  Catheter was further advanced across the aortic valve into the LV.  LV pressures were recorded.  Next, LV graft was done in 30-degree RAO position.  Post-angiographic pressures were recorded from LV and then pullback pressures were recorded from aorta. There was no significant gradient across the aortic valve.  Next, pigtail catheter was pulled out over the wire.  Sheaths were aspirated and flushed.  FINDINGS:  LV showed good LV systolic function, EF of 50-55%.  Left main was patent.  LAD has 10-15% proximal and mid stenosis.  Diagonal 1 has 50-60% ostial and proximal stenosis.  Vessel is small.  Left circumflex has 20-30% ostial  stenosis.  OM1 to OM3 very small which has mild disease.  RCA has 10-15% proximal and mid stenosis.  PDA and PLV branches were patent.  The patient tolerated procedure well.  There were no complications.  Patient was transferred to recovery room in stable condition.  Plan is to start him Eliquis later this afternoon if his groin is stable, and possible discharge home tomorrow if stable.     Eduardo Osier. Sharyn Lull, M.D.     [facility]MED[facility]  D:  08/29/2013  T:  08/29/2013  Job:  [id number]

## 2013-08-29 NOTE — Progress Notes (Signed)
[facility] PROGRESS NOTE  Vincent CraverRobert Benjamin WJX:[id number] DOB: May 20, 1927 DOA: 08/22/2013 PCP: No primary provider on file.  Assessment/Plan:    Atrial fibrillation with RVR - Cardiology currently managing. Patient currently on Cardizem and carvedilol - pt is s/p myoview and cath -ASA/eliquis  Hypomagnasemia - Resolved after IV replacement and within normal limits.  Hypokalemia - replaced orally and within normal limits currently with last value at 4.5  HTN- -lisinopril -cardizem -coreg   Code Status: full Family Communication: patient Disposition:   Consultants:  Cardiology: Dr. Sharyn LullHarwani  Procedures:  None  Antibiotics:  None  HPI/Subjective: S/p cath No new c/o  Objective: Filed Vitals:   08/29/13 1000  BP: 122/61  Pulse: 77  Temp:   Resp:     Intake/Output Summary (Last 24 hours) at 08/29/13 1227 Last data filed at 08/29/13 0644  Gross per 24 hour  Intake  297.5 ml  Output    200 ml  Net   97.5 ml   Filed Weights   08/24/13 1558 08/29/13 0001 08/29/13 0345  Weight: 70.035 kg (154 lb 6.4 oz) 68.856 kg (151 lb 12.8 oz) 68.856 kg (151 lb 12.8 oz)    Exam:   General:  Pt in NAD, alert and awake  Cardiovascular: irregularly irregular, no rubs  Respiratory: CTA BL, no wheezes  Abdomen: soft, NT, ND  Musculoskeletal: no cyanosis or clubbing   Data Reviewed: Basic Metabolic Panel:  Recent Labs Lab 08/22/13 2234 08/24/13 1215 08/25/13 0409 08/26/13 0408  NA 136* 137  --  133*  K 4.1 3.3*  --  4.5  CL 96 100  --  101  CO2 28 25  --  25  GLUCOSE 102* 86  --  90  BUN 9 8  --  9  CREATININE 0.51 0.53  --  0.52  CALCIUM 8.7 7.7*  --  7.9*  MG  --  1.4* 1.8 1.8  PHOS  --  2.4  --   --    Liver Function Tests: No results found for this basename: AST, ALT, ALKPHOS, BILITOT, PROT, ALBUMIN,  in the last 168 hours No results found for this basename: LIPASE, AMYLASE,  in the last 168 hours No results found for this basename:  AMMONIA,  in the last 168 hours CBC:  Recent Labs Lab 08/22/13 2234 08/26/13 1356 08/27/13 0430 08/28/13 0453  WBC 6.0 7.0 6.3 4.4  NEUTROABS 3.8  --   --   --   HGB 15.3 13.6 13.1 12.2*  HCT 44.5 40.4 39.6 36.3*  MCV 92.7 93.5 94.7 94.3  PLT 213 281 297 310   Cardiac Enzymes: No results found for this basename: CKTOTAL, CKMB, CKMBINDEX, TROPONINI,  in the last 168 hours BNP (last 3 results) No results found for this basename: PROBNP,  in the last 8760 hours CBG: No results found for this basename: GLUCAP,  in the last 168 hours  Recent Results (from the past 240 hour(s))  MRSA PCR SCREENING     Status: None   Collection Time    08/23/13  1:19 AM      Result Value Ref Range Status   MRSA by PCR NEGATIVE  NEGATIVE Final   Comment:            The GeneXpert MRSA Assay (FDA     approved for NASAL specimens     only), is one component of a     comprehensive MRSA colonization     surveillance program. It is not  intended to diagnose MRSA     infection nor to guide or     monitor treatment for     MRSA infections.     Studies: No results found.  Scheduled Meds: . apixaban  5 mg Oral BID  . aspirin  81 mg Oral Daily  . carvedilol  3.125 mg Oral BID WC  . diltiazem  120 mg Oral Daily  . lisinopril  2.5 mg Oral Daily   Continuous Infusions: . sodium chloride 125 mL/hr at 08/29/13 0827     Time spent: 35 minutes    Vincent Benjamin  [facility] Pager [phone]. If 7PM-7AM, please contact night-coverage at [email or web address], password [facility] 08/29/2013, 12:27 PM  LOS: 7 days

## 2013-08-29 NOTE — H&P (View-Only) (Signed)
Subjective:  Patient denies any chest pain complains of shortness of breath with minimal exertion. Patient underwent Lexiscan Myoview yesterday which showed small area of reversible ischemia in the apex with EF of 50%.   Objective:  Vital Signs in the last 24 hours: Temp:  [97.3 F (36.3 C)-98.2 F (36.8 C)] 97.4 F (36.3 C) (06/01 1526) Pulse Rate:  [77-108] 80 (06/01 1628) Resp:  [19-20] 20 (06/01 1526) BP: (91-118)/(55-80) 116/78 mmHg (06/01 1628) SpO2:  [97 %-100 %] 100 % (06/01 1526)  Intake/Output from previous day: 05/31 0701 - 06/01 0700 In: 504 [P.O.:240; I.V.:264] Out: 800 [Urine:800] Intake/Output from this shift: Total I/O In: 44 [I.V.:44] Out: 200 [Urine:200]  Physical Exam: Neck: no adenopathy, no carotid bruit, no JVD and supple, symmetrical, trachea midline Lungs: clear to auscultation bilaterally Heart: irregularly irregular rhythm, S1, S2 normal and Soft systolic murmur noted Abdomen: soft, non-tender; bowel sounds normal; no masses,  no organomegaly Extremities: extremities normal, atraumatic, no cyanosis or edema  Lab Results:  Recent Labs  08/27/13 0430 08/28/13 0453  WBC 6.3 4.4  HGB 13.1 12.2*  PLT 297 310    Recent Labs  08/26/13 0408  NA 133*  K 4.5  CL 101  CO2 25  GLUCOSE 90  BUN 9  CREATININE 0.52   No results found for this basename: TROPONINI, CK, MB,  in the last 72 hours Hepatic Function Panel No results found for this basename: PROT, ALBUMIN, AST, ALT, ALKPHOS, BILITOT, BILIDIR, IBILI,  in the last 72 hours No results found for this basename: CHOL,  in the last 72 hours No results found for this basename: PROTIME,  in the last 72 hours  Imaging: Imaging results have been reviewed and Nm Myocar Multi W/spect W/wall Motion / Ef  08/27/2013   CLINICAL DATA:  Chest pain, history of atrial fibrillation with RVR  EXAM: MYOCARDIAL IMAGING WITH SPECT (REST AND EXERCISE)  GATED LEFT VENTRICULAR WALL MOTION STUDY  LEFT VENTRICULAR  EJECTION FRACTION  TECHNIQUE: Standard myocardial SPECT imaging was performed after resting intravenous injection of 10 mCi Tc-58m sestamibi. Subsequently, exercise tolerance test was performed by the patient under the supervision of the Cardiology staff. At peak-stress, 30 mCi Tc-31m sestamibi was injected intravenously and standard myocardial SPECT imaging was performed. Quantitative gated imaging was also performed to evaluate left ventricular wall motion, and estimate left ventricular ejection fraction.  COMPARISON:  None.  FINDINGS: The stress SPECT images demonstrate a small, reversible perfusion defect in the apex. Sum difference score 7. Rest images demonstrate no perfusion defects.  The gated stress SPECT images demonstrate normal left ventricular myocardial thickening. Equivocal mild wall motion abnormality involving the apex.  Calculated left ventricular end-diastolic volume 66 mL, end-systolic volume 33 mL, ejection fraction of 50%.  IMPRESSION: 1. Suspected small, reversible perfusion defect in the apex, suspicious for pharmacologic-stress induced ischemia.  2. Left ventricular ejection fraction 50%.  These results were called by telephone at the time of interpretation on 08/27/2013 at 1:02 PM to Dr. Rinaldo Cloud , who verbally acknowledged these results.   Electronically Signed   By: Charline Bills M.D.   On: 08/27/2013 13:06    Cardiac Studies:  Assessment/Plan:  Exertional dyspnea/generalized weakness probably angina equivalent mildly positive nuclear stress test rule out coronary insufficiency Chronic atrial fibrillation Hypertension Hypercholesteremia Plan Discussed with patient at length various options of treatment i.e. medical as patient stress test is minimally abnormal versus invasive left cath possible PTCA stenting its risk and benefits i.e. death MI stroke  need for emergency CABG local vessel complications bleeding infection etc. and consented for PCI. Patient tentatively  scheduled for first case in a.m. Will ask Dr. Cena BentonVega [facility].  LOS: 6 days    Vincent Benjamin 08/28/2013, 6:12 PM

## 2013-08-29 NOTE — CV Procedure (Signed)
Left cardiac cath report dictated on 08/29/2013 dictation [phone]

## 2013-08-29 NOTE — Discharge Instructions (Signed)
Information on my medicine - ELIQUIS (apixaban)  This medication education was reviewed with me or my healthcare representative as part of my discharge preparation.  The pharmacist that spoke with me during my hospital stay was:  Renaee Munda, [facility]  Why was Eliquis prescribed for you? Eliquis was prescribed for you to reduce the risk of a blood clot forming that can cause a stroke if you have a medical condition called atrial fibrillation (a type of irregular heartbeat).  What do You need to know about Eliquis ? Take your Eliquis TWICE DAILY - one tablet in the morning and one tablet in the evening with or without food. If you have difficulty swallowing the tablet whole please discuss with your pharmacist how to take the medication safely.  Take Eliquis exactly as prescribed by your doctor and DO NOT stop taking Eliquis without talking to the doctor who prescribed the medication.  Stopping may increase your risk of developing a stroke.  Refill your prescription before you run out.  After discharge, you should have regular check-up appointments with your healthcare provider that is prescribing your Eliquis.  In the future your dose may need to be changed if your kidney function or weight changes by a significant amount or as you get older.  What do you do if you miss a dose? If you miss a dose, take it as soon as you remember on the same day and resume taking twice daily.  Do not take more than one dose of ELIQUIS at the same time to make up a missed dose.  Important Safety Information A possible side effect of Eliquis is bleeding. You should call your healthcare provider right away if you experience any of the following:   Bleeding from an injury or your nose that does not stop.   Unusual colored urine (red or dark brown) or unusual colored stools (red or black).   Unusual bruising for unknown reasons.   A serious fall or if you hit your head (even if there is no bleeding).  Some  medicines may interact with Eliquis and might increase your risk of bleeding or clotting while on Eliquis. To help avoid this, consult your healthcare provider or pharmacist prior to using any new prescription or non-prescription medications, including herbals, vitamins, non-steroidal anti-inflammatory drugs (NSAIDs) and supplements.  This website has more information on Eliquis (apixaban): www.[employer].com.cy.

## 2013-08-29 NOTE — Interval H&P Note (Signed)
Cath Lab Visit (complete for each Cath Lab visit)  Clinical Evaluation Leading to the Procedure:   ACS: no  Non-ACS:    Anginal Classification: CCS III  Anti-ischemic medical therapy: Maximal Therapy (2 or more classes of medications)  Non-Invasive Test Results: Low-risk stress test findings: cardiac mortality <1%/year  Prior CABG: No previous CABG      History and Physical Interval Note:  08/29/2013 7:27 AM  Vincent Benjamin  has presented today for surgery, with the diagnosis of cp  The various methods of treatment have been discussed with the patient and family. After consideration of risks, benefits and other options for treatment, the patient has consented to  Procedure(s): LEFT HEART CATHETERIZATION WITH CORONARY ANGIOGRAM (N/A) as a surgical intervention .  The patient's history has been reviewed, patient examined, no change in status, stable for surgery.  I have reviewed the patient's chart and labs.  Questions were answered to the patient's satisfaction.     Vincent Benjamin Vincent Benjamin

## 2013-08-30 LAB — CBC
HCT: 36.9 % — ABNORMAL LOW (ref 39.0–52.0)
Hemoglobin: 12.8 g/dL — ABNORMAL LOW (ref 13.0–17.0)
MCH: 32.3 pg (ref 26.0–34.0)
MCHC: 34.7 g/dL (ref 30.0–36.0)
MCV: 93.2 fL (ref 78.0–100.0)
Platelets: 368 10*3/uL (ref 150–400)
RBC: 3.96 MIL/uL — ABNORMAL LOW (ref 4.22–5.81)
RDW: 14.8 % (ref 11.5–15.5)
WBC: 4.5 10*3/uL (ref 4.0–10.5)

## 2013-08-30 LAB — BASIC METABOLIC PANEL
BUN: 3 mg/dL — AB (ref 6–23)
CO2: 26 mEq/L (ref 19–32)
Calcium: 8.3 mg/dL — ABNORMAL LOW (ref 8.4–10.5)
Chloride: 103 mEq/L (ref 96–112)
Creatinine, Ser: 0.54 mg/dL (ref 0.50–1.35)
Glucose, Bld: 80 mg/dL (ref 70–99)
POTASSIUM: 4 meq/L (ref 3.7–5.3)
Sodium: 140 mEq/L (ref 137–147)

## 2013-08-30 MED ORDER — CARVEDILOL 6.25 MG PO TABS
6.2500 mg | ORAL_TABLET | Freq: Two times a day (BID) | ORAL | Status: DC
  Administered 2013-08-30 – 2013-08-31 (×2): 6.25 mg via ORAL
  Filled 2013-08-30 (×4): qty 1

## 2013-08-30 NOTE — Progress Notes (Signed)
Subjective:  Patient denies any chest pain or shortness of breath. Denies any palpitations. Noted to A. fib with RVR earlier today . No further episodes of pause.  Objective:  Vital Signs in the last 24 hours: Temp:  [97.9 F (36.6 C)-98.2 F (36.8 C)] 98.2 F (36.8 C) (06/03 0805) Pulse Rate:  [72-157] 157 (06/03 0805) Resp:  [15-19] 19 (06/03 0805) BP: (116-146)/(61-92) 130/80 mmHg (06/03 0805) SpO2:  [90 %-100 %] 96 % (06/03 0805) Weight:  [68.629 kg (151 lb 4.8 oz)] 68.629 kg (151 lb 4.8 oz) (06/03 0400)  Intake/Output from previous day: 06/02 0701 - 06/03 0700 In: 180 [P.O.:180] Out: 600 [Urine:600] Intake/Output from this shift: Total I/O In: 120 [P.O.:120] Out: -   Physical Exam: Neck: no adenopathy, no carotid bruit, no JVD and supple, symmetrical, trachea midline Lungs: clear to auscultation bilaterally Heart: irregularly irregular rhythm, S1, S2 normal and Soft systolic murmur noted and Abdomen: soft, non-tender; bowel sounds normal; no masses,  no organomegaly Extremities: extremities normal, atraumatic, no cyanosis or edema and Right groin stable  Lab Results:  Recent Labs  08/28/13 0453 08/30/13 0319  WBC 4.4 4.5  HGB 12.2* 12.8*  PLT 310 368    Recent Labs  08/30/13 0319  NA 140  K 4.0  CL 103  CO2 26  GLUCOSE 80  BUN 3*  CREATININE 0.54   No results found for this basename: TROPONINI, CK, MB,  in the last 72 hours Hepatic Function Panel No results found for this basename: PROT, ALBUMIN, AST, ALT, ALKPHOS, BILITOT, BILIDIR, IBILI,  in the last 72 hours No results found for this basename: CHOL,  in the last 72 hours No results found for this basename: PROTIME,  in the last 72 hours  Imaging: Imaging results have been reviewed and No results found.  Cardiac Studies:  Assessment/Plan:  Mild coronary artery disease status post left cath Chronic atrial fibrillation  Hypertension  Hypercholesteremia Plan Agree with increasing  carvedilol Increase ambulation as tolerated Okay to discharge from cardiac point of view once heart rate better controlled  LOS: 8 days    Robynn Pane 08/30/2013, 9:57 AM

## 2013-08-30 NOTE — Progress Notes (Signed)
Clinical Social Work Department BRIEF PSYCHOSOCIAL ASSESSMENT 08/30/2013  Patient:  Vincent Benjamin, Vincent Benjamin     Account Number:  [id number]     Admit date:  08/22/2013  Clinical Social Worker:  Harless Nakayama  Date/Time:  08/30/2013 01:30 PM  Referred by:  Physician  Date Referred:  08/30/2013 Referred for  SNF Placement   Other Referral:   Interview type:  Patient Other interview type:    PSYCHOSOCIAL DATA Living Status:  ALONE Admitted from facility:   Level of care:   Primary support name:  Orrin Brigham [phone] Primary support relationship to patient:  CHILD, ADULT Degree of support available:   Pt has good support system    CURRENT CONCERNS Current Concerns  Post-Acute Placement   Other Concerns:    SOCIAL WORK ASSESSMENT / PLAN CSW aware of PT recommendation for SNF. CSW visited pt room and spoke with pt about recommendation. Pt informed CSW he is from [place] and lives alone there. Pt reports having good family support (not local) and good support from neighbors. CSW explained ST rehab at SNF to pt. Pt aware of what ST rehab entails and has politely declined at this time. CSW aware that pt also previously decline HH services. CSW asked if pt would now want to reconsider these services based on higher level of assitance. Pt informed CSW he did not know what HH was or what they provided. CSW provided brief explanation to pt and offered to have RN CM speak with pt. Pt was agreeable to [facility] services and informed CSW he was very happy to get to dc home. CSW inquired about how pt would return back to [place]. Pt informed CSW he had his car at his son-in-laws house but if he could not drive the distance his family would take him home. Pt had no concerns at this time. Pt has no further social work needs. CSW updated RN CM. CSW signing off.   Assessment/plan status:  No Further Intervention Required Other assessment/ plan:   Information/referral to community resources:   SNF denied     PATIENT'S/FAMILY'S RESPONSE TO PLAN OF CARE: Pt would like to dc home with [facility] services       Batsheva Stevick, LCSWA [phone]

## 2013-08-30 NOTE — Progress Notes (Signed)
[facility] PROGRESS NOTE  Ramond CraverRobert Trott JXB:[id number] DOB: 1927/06/15 DOA: 08/22/2013 PCP: No primary provider on file.  Assessment/Plan:    Atrial fibrillation with RVR -Cardizem and carvedilol- increase in HR this AM- will increase coreg for today - pt is s/p myoview and cath- essentially normal -ASA/eliquis  Hypomagnasemia - Resolved after IV replacement and within normal limits.  Hypokalemia - replaced orally and within normal limits currently with last value at 4.5  HTN- -lisinopril -cardizem -coreg  C/o back pain/leg weakness -x ray ok -check MRI  Ambulate patient by PT and hope if HR control for d/c in AM  Code Status: full Family Communication: patient Disposition:   Consultants:  Cardiology: Dr. Sharyn LullHarwani  Procedures:  None  Antibiotics:  None  HPI/Subjective: S/p cath Says he has been having trouble with weakness of his legs and shooting pain down legs   Objective: Filed Vitals:   08/30/13 0805  BP: 130/80  Pulse: 157  Temp: 98.2 F (36.8 C)  Resp: 19    Intake/Output Summary (Last 24 hours) at 08/30/13 0840 Last data filed at 08/30/13 0838  Gross per 24 hour  Intake    300 ml  Output    600 ml  Net   -300 ml   Filed Weights   08/29/13 0001 08/29/13 0345 08/30/13 0400  Weight: 68.856 kg (151 lb 12.8 oz) 68.856 kg (151 lb 12.8 oz) 68.629 kg (151 lb 4.8 oz)    Exam:   General:  Pt in NAD, alert and awake  Cardiovascular: irregularly irregular, no rubs  Respiratory: CTA BL, no wheezes  Abdomen: soft, NT, ND  Musculoskeletal: no cyanosis or clubbing   Data Reviewed: Basic Metabolic Panel:  Recent Labs Lab 08/24/13 1215 08/25/13 0409 08/26/13 0408 08/30/13 0319  NA 137  --  133* 140  K 3.3*  --  4.5 4.0  CL 100  --  101 103  CO2 25  --  25 26  GLUCOSE 86  --  90 80  BUN 8  --  9 3*  CREATININE 0.53  --  0.52 0.54  CALCIUM 7.7*  --  7.9* 8.3*  MG 1.4* 1.8 1.8  --   PHOS 2.4  --   --   --    Liver  Function Tests: No results found for this basename: AST, ALT, ALKPHOS, BILITOT, PROT, ALBUMIN,  in the last 168 hours No results found for this basename: LIPASE, AMYLASE,  in the last 168 hours No results found for this basename: AMMONIA,  in the last 168 hours CBC:  Recent Labs Lab 08/26/13 1356 08/27/13 0430 08/28/13 0453 08/30/13 0319  WBC 7.0 6.3 4.4 4.5  HGB 13.6 13.1 12.2* 12.8*  HCT 40.4 39.6 36.3* 36.9*  MCV 93.5 94.7 94.3 93.2  PLT 281 297 310 368   Cardiac Enzymes: No results found for this basename: CKTOTAL, CKMB, CKMBINDEX, TROPONINI,  in the last 168 hours BNP (last 3 results) No results found for this basename: PROBNP,  in the last 8760 hours CBG: No results found for this basename: GLUCAP,  in the last 168 hours  Recent Results (from the past 240 hour(s))  MRSA PCR SCREENING     Status: None   Collection Time    08/23/13  1:19 AM      Result Value Ref Range Status   MRSA by PCR NEGATIVE  NEGATIVE Final   Comment:            The GeneXpert MRSA Assay (FDA  approved for NASAL specimens     only), is one component of a     comprehensive MRSA colonization     surveillance program. It is not     intended to diagnose MRSA     infection nor to guide or     monitor treatment for     MRSA infections.     Studies: No results found.  Scheduled Meds: . apixaban  5 mg Oral BID  . aspirin  81 mg Oral Daily  . carvedilol  6.25 mg Oral BID WC  . diltiazem  120 mg Oral Daily  . lisinopril  2.5 mg Oral Daily   Continuous Infusions:     Time spent: 25 minutes    Joseph Art  [facility] Pager [phone]. If 7PM-7AM, please contact night-coverage at [email or web address], password [facility] 08/30/2013, 8:40 AM  LOS: 8 days

## 2013-08-30 NOTE — Evaluation (Signed)
Physical Therapy Evaluation Patient Details Name: Vincent Benjamin MRN: [id number] DOB: 28-Feb-1928 Today's Date: 08/30/2013   History of Present Illness  Pt is an 80 year old male visiting his daughter (in SNF) from [place] and presented to ED with back pain felt to most likely be musculoskeletal; however, during his work up his heart rate was noted to be in the 160s and found to be in A.Fib with RVR.    Clinical Impression  Pt fatigued quickly today with mobility. Pt continues to c/o muscle spasms with mobility in Rt LE. Pt HR reached 135 with ambulation; would return to <100 with rest and deep breathing. Pt continues to be unsteady with gt and has difficulty with bed mobility and transfers secondary to weakness. Pt lives with brother who is in poor health and cannot provide physical (A). D/C disposition updated to SNF for post acute rehab.      Follow Up Recommendations Supervision/Assistance - 24 hour;SNF    Equipment Recommendations  Rolling walker with 5" wheels    Recommendations for Other Services       Precautions / Restrictions Precautions Precautions: Fall Restrictions Weight Bearing Restrictions: No      Mobility  Bed Mobility Overal bed mobility: Needs Assistance Bed Mobility: Supine to Sit     Supine to sit: Min assist;HOB elevated     General bed mobility comments: effortful for pt to advance LEs to/off EOB; min (A) to bring trunk to sitting position; cues for hand placement and sequencing   Transfers Overall transfer level: Needs assistance Equipment used: Rolling walker (2 wheeled) Transfers: Sit to/from Stand Sit to Stand: Min guard         General transfer comment: min guard; pt unsteady with inital sit to stand with cues for hand placement and safety with RW   Ambulation/Gait Ambulation/Gait assistance: Min guard Ambulation Distance (Feet): 150 Feet Assistive device: Rolling walker (2 wheeled) Gait Pattern/deviations: Shuffle;Decreased stride  length;Step-through pattern;Wide base of support Gait velocity: decreased Gait velocity interpretation: Below normal speed for age/gender General Gait Details: pt with short shuffled gt; increased fwd trunk flexion with fatigue; cues for upright posture and safety with gt; pt fatigued quickly    Stairs            Wheelchair Mobility    Modified Rankin (Stroke Patients Only)       Balance Overall balance assessment: Needs assistance;History of Falls Sitting-balance support: Feet supported;No upper extremity supported Sitting balance-Leahy Scale: Fair     Standing balance support: During functional activity;Bilateral upper extremity supported Standing balance-Leahy Scale: Poor Standing balance comment: pt unsteady with sway              High level balance activites: Head turns;Direction changes;Turns High Level Balance Comments: unsteady with RW             Pertinent Vitals/Pain HR max 135 with activity; RN notified     Home Living                        Prior Function                 Hand Dominance        Extremity/Trunk Assessment                         Communication      Cognition Arousal/Alertness: Awake/alert Behavior During Therapy: WFL for tasks assessed/performed Overall Cognitive Status: Within Functional Limits for  tasks assessed                      General Comments General comments (skin integrity, edema, etc.): pt c/o muscle spasms in Rt LE     Exercises General Exercises - Lower Extremity Ankle Circles/Pumps: AROM;Both;Strengthening;10 reps;Seated Long Arc Quad: AROM;Strengthening;Both;10 reps;Seated Hip ABduction/ADduction: AROM;Strengthening;Both;10 reps;Seated Hip Flexion/Marching: AROM;Strengthening;Both;10 reps;Seated      Assessment/Plan    PT Assessment    PT Diagnosis     PT Problem List    PT Treatment Interventions     PT Goals (Current goals can be found in the Care Plan  section) Acute Rehab PT Goals Patient Stated Goal: to get stronger  PT Goal Formulation: With patient Time For Goal Achievement: 09/01/13 Potential to Achieve Goals: Good    Frequency Min 3X/week   Barriers to discharge        Co-evaluation               End of Session Equipment Utilized During Treatment: Gait belt Activity Tolerance: Patient limited by fatigue Patient left: in chair;with call bell/phone within reach Nurse Communication: Mobility status;Precautions         Time: 0623-7628 PT Time Calculation (min): 24 min   Charges:     PT Treatments $Gait Training: 8-22 mins $Therapeutic Exercise: 8-22 mins   PT G CodesNadara Mustard [place],   [phone] 08/30/2013, 12:56 PM

## 2013-08-31 ENCOUNTER — Other Ambulatory Visit: Payer: Self-pay

## 2013-08-31 ENCOUNTER — Inpatient Hospital Stay (HOSPITAL_COMMUNITY): Payer: Medicare PPO

## 2013-08-31 DIAGNOSIS — I25810 Atherosclerosis of coronary artery bypass graft(s) without angina pectoris: Secondary | ICD-10-CM

## 2013-08-31 LAB — CBC
HCT: 36.4 % — ABNORMAL LOW (ref 39.0–52.0)
Hemoglobin: 12.7 g/dL — ABNORMAL LOW (ref 13.0–17.0)
MCH: 32.6 pg (ref 26.0–34.0)
MCHC: 34.9 g/dL (ref 30.0–36.0)
MCV: 93.6 fL (ref 78.0–100.0)
PLATELETS: 342 10*3/uL (ref 150–400)
RBC: 3.89 MIL/uL — ABNORMAL LOW (ref 4.22–5.81)
RDW: 14.9 % (ref 11.5–15.5)
WBC: 4.5 10*3/uL (ref 4.0–10.5)

## 2013-08-31 MED ORDER — GADOBENATE DIMEGLUMINE 529 MG/ML IV SOLN
15.0000 mL | Freq: Once | INTRAVENOUS | Status: AC | PRN
  Administered 2013-08-31: 13 mL via INTRAVENOUS

## 2013-08-31 MED ORDER — LISINOPRIL 2.5 MG PO TABS: 2.5000 mg | ORAL_TABLET | Freq: Every day | ORAL | Status: AC

## 2013-08-31 MED ORDER — APIXABAN 5 MG PO TABS: 5.0000 mg | ORAL_TABLET | Freq: Two times a day (BID) | ORAL | Status: AC

## 2013-08-31 MED ORDER — ASPIRIN 81 MG PO CHEW: 81.0000 mg | CHEWABLE_TABLET | Freq: Every day | ORAL | Status: AC

## 2013-08-31 MED ORDER — HYDROCODONE-ACETAMINOPHEN 5-325 MG PO TABS: 1.0000 | ORAL_TABLET | Freq: Four times a day (QID) | ORAL | Status: AC | PRN

## 2013-08-31 MED ORDER — CARVEDILOL 6.25 MG PO TABS: 6.2500 mg | ORAL_TABLET | Freq: Two times a day (BID) | ORAL | Status: AC

## 2013-08-31 MED ORDER — DILTIAZEM HCL ER COATED BEADS 120 MG PO CP24: 120.0000 mg | ORAL_CAPSULE | Freq: Every day | ORAL | Status: AC

## 2013-08-31 NOTE — Care Management (Signed)
1608 Tomi Bamberger, RN,BSN 08-31-13 Pt did not make CM aware that he was snot returning to [place] [facility] today. Pt states he will be going home with his son-n-law tonight and he will call the [facility] agency when he plans to return home. Lamar Laundry [place], RN,BSN [phone]

## 2013-08-31 NOTE — Discharge Summary (Addendum)
Physician Discharge Summary  Vincent Benjamin SHU:[id number] DOB: 11-16-1927 DOA: 08/22/2013  PCP: No primary provider on file.  Admit date: 08/22/2013 Discharge date: 08/31/2013  Time spent: 35 minutes  Recommendations for Outpatient Follow-up:  1. Needs referral by PCP to neurosurgeon- patient will be given copy of d/c summary to take to PCP 2. Cbc, bmp 1 week   Discharge Diagnoses:  Principal Problem:   Atrial fibrillation with RVR Active Problems:   Hypokalemia   Hypomagnesemia   Discharge Condition: improved  Diet recommendation: cardiac  Filed Weights   08/29/13 0345 08/30/13 0400 08/31/13 0523  Weight: 68.856 kg (151 lb 12.8 oz) 68.629 kg (151 lb 4.8 oz) 66.225 kg (146 lb)    History of present illness:  Vincent Benjamin is a 80 y.o. male from [place], in town visiting daughter who is in a SNF. He presents to the ED with c/o back pain and muscle spasms. Work up in the ED has revealed the back pain to most likely be musculoskeletal; however, during his work up his heart rate was noted to be in the 160s and he was found to be in A.Fib with RVR. He states that he is asymptomatic from this, no CP no SOB, does occasionally have "passing-out" episodes which he describes as his "muscles lock up" and he is "unable to move due to the spasm", does not loose consciousness during these however he states.  He has a history of A.Fib, follows with a cardiologist in [place] but does not take any rate control meds and declines blood thinners (sounds more like a patient refusal than a medical reason not to use them).  In ED, rate controlled with cardizem gtt.   Hospital Course:  Atrial fibrillation with RVR  -Cardizem and carvedilol- controlled - pt is s/p myoview and cath- essentially normal  -ASA/eliquis per cards  Hypomagnasemia  - Resolved after IV replacement and within normal limits.   Hypokalemia  - replaced orally and within normal limits currently   HTN-  -lisinopril   -cardizem  -coreg   C/o back pain/leg weakness  -x ray ok  -MRI- showed results below- side bared with neurosurgery and patient should be referred by PCP to neurosurgery -no WBC count, no fevers- doubt discitis   Procedures:  Cath Stress test: Suspected small, reversible perfusion defect in the apex,  suspicious for pharmacologic-stress induced ischemia.  2. Left ventricular ejection fraction 50%   Consultations:  cards  Discharge Exam: Filed Vitals:   08/31/13 1330  BP: 125/72  Pulse: 69  Temp: 98.1 F (36.7 C)  Resp: 20    General: A+Ox3, NAd Cardiovascular: rrr Respiratory: clear anterior  Discharge Instructions You were cared for by a hospitalist during your hospital stay. If you have any questions about your discharge medications or the care you received while you were in the hospital after you are discharged, you can call the unit and asked to speak with the hospitalist on call if the hospitalist that took care of you is not available. Once you are discharged, your primary care physician will handle any further medical issues. Please note that NO REFILLS for any discharge medications will be authorized once you are discharged, as it is imperative that you return to your primary care physician (or establish a relationship with a primary care physician if you do not have one) for your aftercare needs so that they can reassess your need for medications and monitor your lab values.  Discharge Instructions   Diet - low sodium heart  healthy    Complete by:  As directed      Discharge instructions    Complete by:  As directed   Home health PT/OT -please send CD of MRI and report so patient may be referred by PCP to neurosurgery in area     Increase activity slowly    Complete by:  As directed             Medication List         apixaban 5 MG Tabs tablet  Commonly known as:  ELIQUIS  Take 1 tablet (5 mg total) by mouth 2 (two) times daily.     aspirin 81 MG  chewable tablet  Chew 1 tablet (81 mg total) by mouth daily.     carvedilol 6.25 MG tablet  Commonly known as:  COREG  Take 1 tablet (6.25 mg total) by mouth 2 (two) times daily with a meal.     COD LIVER OIL PO  Take by mouth.     diltiazem 120 MG 24 hr capsule  Commonly known as:  CARDIZEM CD  Take 1 capsule (120 mg total) by mouth daily.     FLEXI JOINT PO  Take 1 tablet by mouth daily.     HYDROcodone-acetaminophen 5-325 MG per tablet  Commonly known as:  NORCO/VICODIN  Take 1 tablet by mouth every 6 (six) hours as needed for moderate pain.     lisinopril 2.5 MG tablet  Commonly known as:  PRINIVIL,ZESTRIL  Take 1 tablet (2.5 mg total) by mouth daily.     multivitamin with minerals Tabs tablet  Take 1 tablet by mouth daily.     SAW PALMETTO PO  Take 1 tablet by mouth daily.     VITAMIN E PO  Take 1 tablet by mouth daily.       Allergies  Allergen Reactions  . Coumadin [Warfarin Sodium]     Stomach upset  . Pradaxa [Dabigatran Etexilate Mesylate]     SOB, sleeplessness, nausea       Follow-up Information   [facility]. (Registered Nurse, Physical and Occupational Therapy, )    Contact information:    Home Health Care Service Address: [address], [place], [place] [phone]        The results of significant diagnostics from this hospitalization (including imaging, microbiology, ancillary and laboratory) are listed below for reference.    Significant Diagnostic Studies: Dg Lumbar Spine Complete  08/23/2013   CLINICAL DATA:  Severe lower back pain, radiating down both legs.  EXAM: LUMBAR SPINE - COMPLETE 4+ VIEW  COMPARISON:  None.  FINDINGS: There is no evidence of fracture or subluxation. Vertebral bodies demonstrate normal height and alignment. Mild multilevel disc space narrowing is noted, with scattered anterior disc osteophyte complexes. There is mild right convex thoracolumbar scoliosis.  The  visualized bowel gas pattern is unremarkable in appearance; air and stool are noted within the colon. The sacroiliac joints are within normal limits. Diffuse vascular calcifications are seen.  IMPRESSION: 1. No evidence of fracture or subluxation along the lumbar spine. 2. Mild right convex thoracolumbar scoliosis. 3. Diffuse vascular calcifications seen.   Electronically Signed   By: Roanna RaiderJeffery  Chang M.D.   On: 08/23/2013 00:18   Mr Lumbar Spine W Wo Contrast  08/31/2013   CLINICAL DATA:  Severe low back pain. Muscle spasms. Lower extremity weakness and pain.  EXAM: MRI LUMBAR SPINE WITHOUT AND WITH CONTRAST  TECHNIQUE: Multiplanar and  multiecho pulse sequences of the lumbar spine were obtained without and with intravenous contrast.  CONTRAST:  13 mL MULTIHANCE GADOBENATE DIMEGLUMINE 529 MG/ML IV SOLN  COMPARISON:  Plain films lumbar spine 08/22/2013.  FINDINGS: Vertebral body height is maintained. There is marked convex right scoliosis. Multilevel degenerative endplate signal change is identified. Fluid is seen in the disc interspaces at L3-4 and L4-5. Trace amount of fluid is also seen in the L1-2 and L2-3 disc interspaces. Rudimentary disc material at the S1-2 level is noted. The conus medullaris is normal in signal and position. Imaged intra-abdominal contents are unremarkable.  The T10-11 level is imaged in the sagittal plane only. There is a shallow disc bulge without central canal or foraminal stenosis.  T11-12: Shallow disc bulge is identified and there is some facet arthropathy. The central spinal canal and neural foramina remain open.  T12-L1: Shallow disc bulge without central canal or foraminal stenosis.  L1-2: Shallow disc bulge with mild ligamentum flavum thickening and facet degenerative disease. The central canal and left foramen are mildly narrowed. The right foramen is open.  L2-3: Shallow disc bulge with ligamentum flavum thickening and facet arthropathy cause mild central canal and moderate  bilateral foraminal narrowing.  L3-4: The patient has a broad-based disc bulge with some ligamentum flavum thickening and facet degenerative disease. There is moderately severe central canal stenosis. Marked narrowing is present in the right lateral recess and foramen. Moderately severe left foraminal narrowing is identified. There is postcontrast enhancement about the endplates at this level. Also seen is epidural enhancement eccentric to the right and about the right L3 root.  L4-5: There is a disc bulge and endplate spurring. The central canal appears open. The right foramen is severely narrowed. Moderate to moderately severe left foraminal narrowing is identified. There is mild postcontrast enhancement about the endplates at this level. Epidural enhancement is seen eccentric to the right about the thecal sac and right L4 root.  L5-S1: There is a disc bulge with endplate spurring in a shallow central protrusion. The central canal is mildly narrowed. Moderate to moderately severe foraminal narrowing is worse on the right. There is some epidural enhancement about the thecal sac best seen posteriorly.  IMPRESSION: Fluid in the disc interspaces with endplate edema and enhancement as described above is most notable at L3-4. Epidural enhancement eccentric to the right is best seen from L3-4 to L5-S1. Findings could be secondary to advanced degenerative disease but are worrisome for discitis/osteomyelitis. No abscess is identified.  Multilevel spondylosis appears worst at L3-4, L4-5 and L5-S1 as described above.   Electronically Signed   By: Drusilla Kanner M.D.   On: 08/31/2013 08:34   Nm Myocar Multi W/spect W/wall Motion / Ef  08/27/2013   CLINICAL DATA:  Chest pain, history of atrial fibrillation with RVR  EXAM: MYOCARDIAL IMAGING WITH SPECT (REST AND EXERCISE)  GATED LEFT VENTRICULAR WALL MOTION STUDY  LEFT VENTRICULAR EJECTION FRACTION  TECHNIQUE: Standard myocardial SPECT imaging was performed after resting  intravenous injection of 10 mCi Tc-8m sestamibi. Subsequently, exercise tolerance test was performed by the patient under the supervision of the Cardiology staff. At peak-stress, 30 mCi Tc-87m sestamibi was injected intravenously and standard myocardial SPECT imaging was performed. Quantitative gated imaging was also performed to evaluate left ventricular wall motion, and estimate left ventricular ejection fraction.  COMPARISON:  None.  FINDINGS: The stress SPECT images demonstrate a small, reversible perfusion defect in the apex. Sum difference score 7. Rest images demonstrate no perfusion defects.  The gated stress SPECT images demonstrate normal left ventricular myocardial thickening. Equivocal mild wall motion abnormality involving the apex.  Calculated left ventricular end-diastolic volume 66 mL, end-systolic volume 33 mL, ejection fraction of 50%.  IMPRESSION: 1. Suspected small, reversible perfusion defect in the apex, suspicious for pharmacologic-stress induced ischemia.  2. Left ventricular ejection fraction 50%.  These results were called by telephone at the time of interpretation on 08/27/2013 at 1:02 PM to Dr. Rinaldo Cloud , who verbally acknowledged these results.   Electronically Signed   By: Charline Bills M.D.   On: 08/27/2013 13:06    Microbiology: Recent Results (from the past 240 hour(s))  MRSA PCR SCREENING     Status: None   Collection Time    08/23/13  1:19 AM      Result Value Ref Range Status   MRSA by PCR NEGATIVE  NEGATIVE Final   Comment:            The GeneXpert MRSA Assay (FDA     approved for NASAL specimens     only), is one component of a     comprehensive MRSA colonization     surveillance program. It is not     intended to diagnose MRSA     infection nor to guide or     monitor treatment for     MRSA infections.     Labs: Basic Metabolic Panel:  Recent Labs Lab 08/25/13 0409 08/26/13 0408 08/30/13 0319  NA  --  133* 140  K  --  4.5 4.0  CL  --   101 103  CO2  --  25 26  GLUCOSE  --  90 80  BUN  --  9 3*  CREATININE  --  0.52 0.54  CALCIUM  --  7.9* 8.3*  MG 1.8 1.8  --    Liver Function Tests: No results found for this basename: AST, ALT, ALKPHOS, BILITOT, PROT, ALBUMIN,  in the last 168 hours No results found for this basename: LIPASE, AMYLASE,  in the last 168 hours No results found for this basename: AMMONIA,  in the last 168 hours CBC:  Recent Labs Lab 08/26/13 1356 08/27/13 0430 08/28/13 0453 08/30/13 0319 08/31/13 0422  WBC 7.0 6.3 4.4 4.5 4.5  HGB 13.6 13.1 12.2* 12.8* 12.7*  HCT 40.4 39.6 36.3* 36.9* 36.4*  MCV 93.5 94.7 94.3 93.2 93.6  PLT 281 297 310 368 342   Cardiac Enzymes: No results found for this basename: CKTOTAL, CKMB, CKMBINDEX, TROPONINI,  in the last 168 hours BNP: BNP (last 3 results) No results found for this basename: PROBNP,  in the last 8760 hours CBG: No results found for this basename: GLUCAP,  in the last 168 hours     Signed:  Joseph Art  [facility] 08/31/2013, 2:59 PM

## 2013-08-31 NOTE — Progress Notes (Signed)
Subjective:  Patient denies any chest pain or shortness of breath. Heart rate better controlled  Objective:  Vital Signs in the last 24 hours: Temp:  [98 F (36.7 C)-98.3 F (36.8 C)] 98.3 F (36.8 C) (06/04 0523) Pulse Rate:  [87-117] 112 (06/04 0822) Resp:  [18-20] 20 (06/04 0523) BP: (116-136)/(67-95) 127/70 mmHg (06/04 0822) SpO2:  [92 %-97 %] 92 % (06/04 0523) Weight:  [66.225 kg (146 lb)] 66.225 kg (146 lb) (06/04 0523)  Intake/Output from previous day: 06/03 0701 - 06/04 0700 In: 510 [P.O.:510] Out: 750 [Urine:750] Intake/Output from this shift:    Physical Exam: Exam unchanged  Lab Results:  Recent Labs  08/30/13 0319 08/31/13 0422  WBC 4.5 4.5  HGB 12.8* 12.7*  PLT 368 342    Recent Labs  08/30/13 0319  NA 140  K 4.0  CL 103  CO2 26  GLUCOSE 80  BUN 3*  CREATININE 0.54   No results found for this basename: TROPONINI, CK, MB,  in the last 72 hours Hepatic Function Panel No results found for this basename: PROT, ALBUMIN, AST, ALT, ALKPHOS, BILITOT, BILIDIR, IBILI,  in the last 72 hours No results found for this basename: CHOL,  in the last 72 hours No results found for this basename: PROTIME,  in the last 72 hours  Imaging: Imaging results have been reviewed and Mr Lumbar Spine W Wo Contrast  08/31/2013   CLINICAL DATA:  Severe low back pain. Muscle spasms. Lower extremity weakness and pain.  EXAM: MRI LUMBAR SPINE WITHOUT AND WITH CONTRAST  TECHNIQUE: Multiplanar and multiecho pulse sequences of the lumbar spine were obtained without and with intravenous contrast.  CONTRAST:  13 mL MULTIHANCE GADOBENATE DIMEGLUMINE 529 MG/ML IV SOLN  COMPARISON:  Plain films lumbar spine 08/22/2013.  FINDINGS: Vertebral body height is maintained. There is marked convex right scoliosis. Multilevel degenerative endplate signal change is identified. Fluid is seen in the disc interspaces at L3-4 and L4-5. Trace amount of fluid is also seen in the L1-2 and L2-3 disc  interspaces. Rudimentary disc material at the S1-2 level is noted. The conus medullaris is normal in signal and position. Imaged intra-abdominal contents are unremarkable.  The T10-11 level is imaged in the sagittal plane only. There is a shallow disc bulge without central canal or foraminal stenosis.  T11-12: Shallow disc bulge is identified and there is some facet arthropathy. The central spinal canal and neural foramina remain open.  T12-L1: Shallow disc bulge without central canal or foraminal stenosis.  L1-2: Shallow disc bulge with mild ligamentum flavum thickening and facet degenerative disease. The central canal and left foramen are mildly narrowed. The right foramen is open.  L2-3: Shallow disc bulge with ligamentum flavum thickening and facet arthropathy cause mild central canal and moderate bilateral foraminal narrowing.  L3-4: The patient has a broad-based disc bulge with some ligamentum flavum thickening and facet degenerative disease. There is moderately severe central canal stenosis. Marked narrowing is present in the right lateral recess and foramen. Moderately severe left foraminal narrowing is identified. There is postcontrast enhancement about the endplates at this level. Also seen is epidural enhancement eccentric to the right and about the right L3 root.  L4-5: There is a disc bulge and endplate spurring. The central canal appears open. The right foramen is severely narrowed. Moderate to moderately severe left foraminal narrowing is identified. There is mild postcontrast enhancement about the endplates at this level. Epidural enhancement is seen eccentric to the right about the thecal sac and  right L4 root.  L5-S1: There is a disc bulge with endplate spurring in a shallow central protrusion. The central canal is mildly narrowed. Moderate to moderately severe foraminal narrowing is worse on the right. There is some epidural enhancement about the thecal sac best seen posteriorly.  IMPRESSION:  Fluid in the disc interspaces with endplate edema and enhancement as described above is most notable at L3-4. Epidural enhancement eccentric to the right is best seen from L3-4 to L5-S1. Findings could be secondary to advanced degenerative disease but are worrisome for discitis/osteomyelitis. No abscess is identified.  Multilevel spondylosis appears worst at L3-4, L4-5 and L5-S1 as described above.   Electronically Signed   By: Drusilla Kannerhomas  Dalessio M.D.   On: 08/31/2013 08:34    Cardiac Studies:  Assessment/Plan:  Mild coronary artery disease status post left cath  Chronic atrial fibrillation  Hypertension  Hypercholesteremia  Plan Continue present management Increase beta blockers as tolerated Okay to discharge from cardiac point of view I will sign off please call if needed  LOS: 9 days    Robynn PaneMohan N Jaccob Czaplicki 08/31/2013, 10:09 AM

## 2013-08-31 NOTE — Care Management (Signed)
08-31-13 1646 co pay for 45.00 for eliquis. Lamar Laundry [place], RN,BSN [phone]

## 2014-03-08 ENCOUNTER — Encounter (HOSPITAL_COMMUNITY): Payer: Self-pay | Admitting: Cardiology

## 2015-05-12 IMAGING — NM NM MYOCAR MULTI W/SPECT W/WALL MOTION & EF
1 series · 6 of 6 positions shown · non-contrast
Comparison: None.

CLINICAL DATA: Chest pain, history of atrial fibrillation with RVR

EXAM:
MYOCARDIAL IMAGING WITH SPECT (REST AND EXERCISE)
GATED LEFT VENTRICULAR WALL MOTION STUDY
LEFT VENTRICULAR EJECTION FRACTION
TECHNIQUE: Standard myocardial SPECT imaging was performed after resting
intravenous injection of 10 mCi Hc-RRm sestamibi. Subsequently,
exercise tolerance test was performed by the patient under the
supervision of the Cardiology staff. At peak-stress, 30 mCi Hc-RRm
sestamibi was injected intravenously and standard myocardial SPECT
imaging was performed. Quantitative gated imaging was also performed
to evaluate left ventricular wall motion, and estimate left
ventricular ejection fraction.

[Series 1: stress gated - (id) _(id)_sa · 8.3mm · 8.28mm/px · 6 of 512 frames shown]
[frame 43/512]
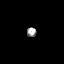
[frame 128/512]
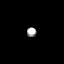
[frame 214/512]
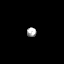
[frame 299/512]
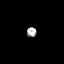
[frame 384/512]
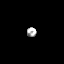
[frame 470/512]
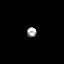

[6 of 6 positions shown; findings below may reference images not displayed]

FINDINGS: The stress SPECT images demonstrate a small, reversible perfusion
defect in the apex. Sum difference score 7. Rest images demonstrate
no perfusion defects.

The gated stress SPECT images demonstrate normal left ventricular
myocardial thickening. Equivocal mild wall motion abnormality
involving the apex.

Calculated left ventricular end-diastolic volume 66 mL, end-systolic
volume 33 mL, ejection fraction of 50%.
IMPRESSION: 1. Suspected small, reversible perfusion defect in the apex,
suspicious for pharmacologic-stress induced ischemia.

2. Left ventricular ejection fraction 50%.

These results were called by telephone at the time of interpretation
on 08/27/2013 at [DATE] to Dr. HJ MD SIEW LING , who verbally
acknowledged these results.

## 2015-09-28 DEATH — deceased
